# Patient Record
Sex: Female | Born: 1985 | Race: Black or African American | Hispanic: No | Marital: Single | Smoking: Current every day smoker
Health system: Southern US, Community
[De-identification: ages and names within clinical notes are randomized; demographics above are authoritative.]

## PROBLEM LIST (undated history)

## (undated) DIAGNOSIS — D649 Anemia, unspecified: Secondary | ICD-10-CM

---
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
## 2007-05-05 ENCOUNTER — Emergency Department (HOSPITAL_COMMUNITY): Admission: EM | Admit: 2007-05-05 | Discharge: 2007-05-05 | Payer: Self-pay | Admitting: Emergency Medicine

## 2007-09-09 ENCOUNTER — Emergency Department (HOSPITAL_COMMUNITY): Admission: EM | Admit: 2007-09-09 | Discharge: 2007-09-09 | Payer: Self-pay | Admitting: Emergency Medicine

## 2009-08-03 IMAGING — US US TRANSVAGINAL NON-OB
1 series · 14 of 25 positions shown · non-contrast
Comparison: None

CLINICAL DATA: Pelvic pain

TRANSVAGINAL ULTRASOUND OF PELVIS
TECHNIQUE: Transvaginal ultrasound examination of the pelvis was
performed including evaluation of the uterus, ovaries, adnexal
regions, and pelvic cul-de-sac.

[Series 1: unknown · 0.32mm/px · 14 of 51 slices shown]
[im 1/51]
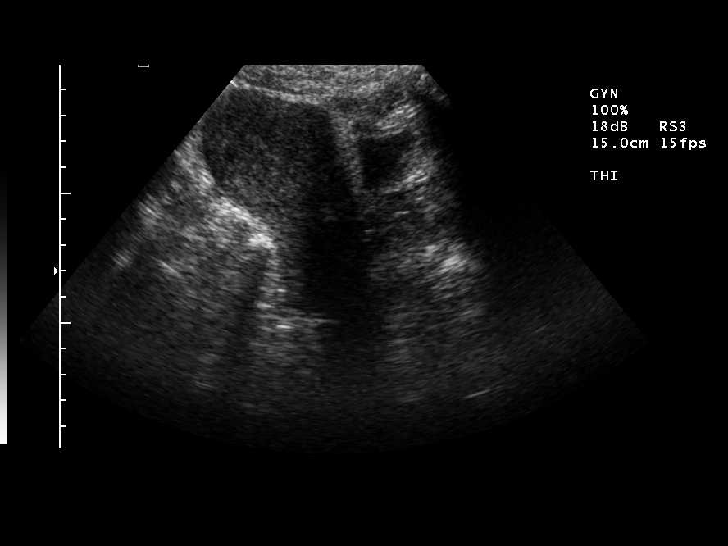
[im 5/51]
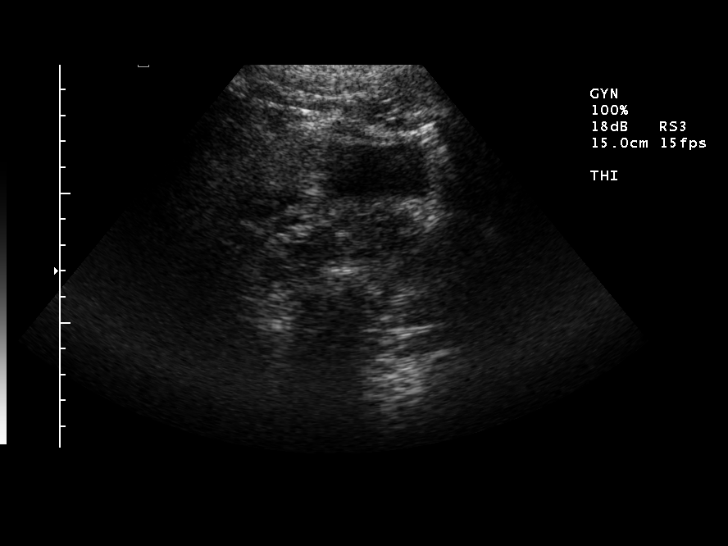
[im 9/51]
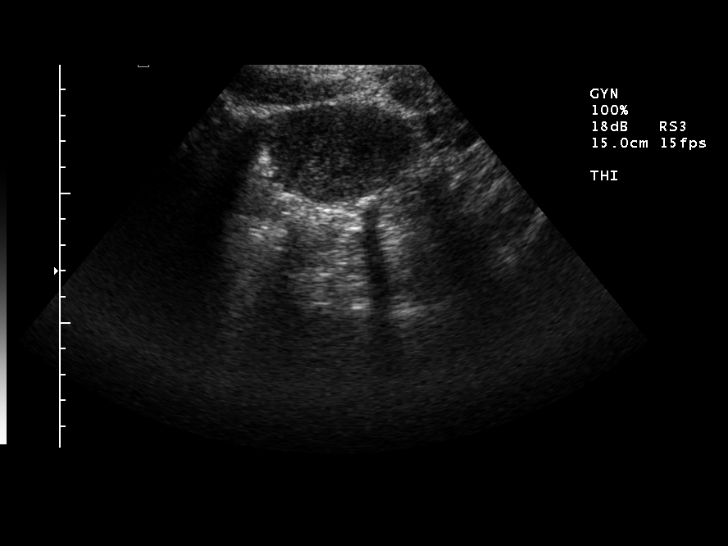
[im 13/51]
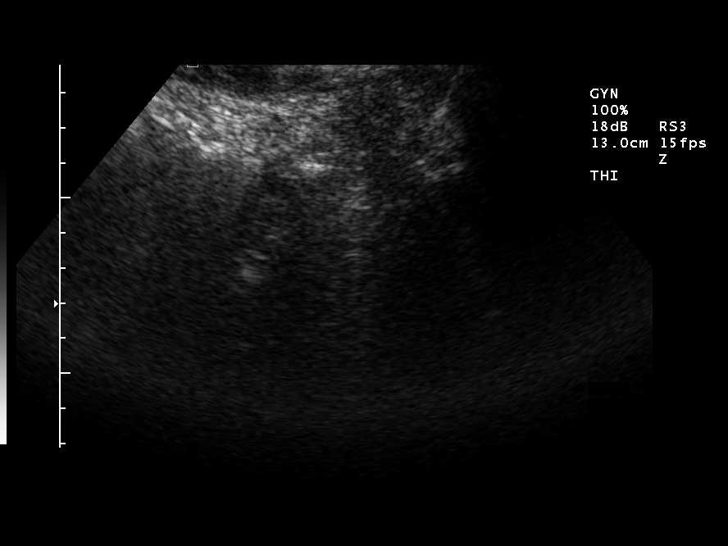
[im 17/51]
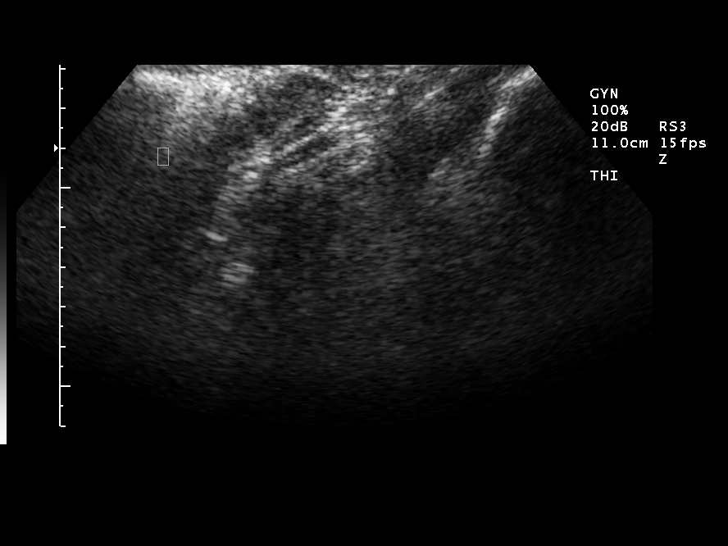
[im 19/51]
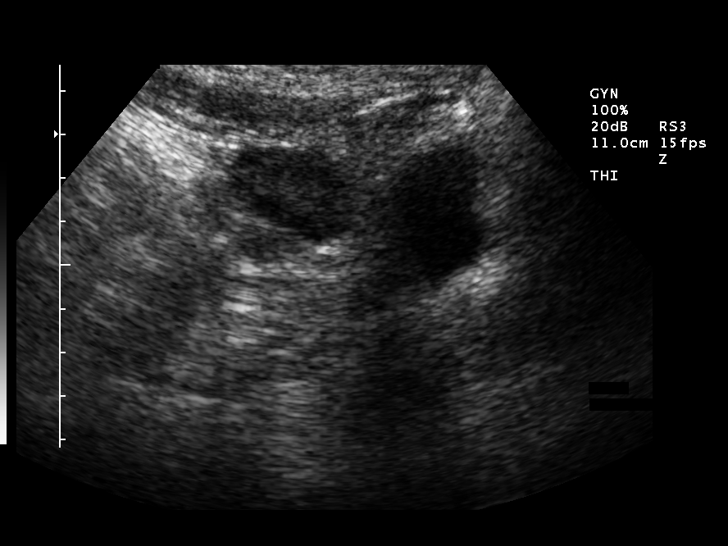
[im 23/51]
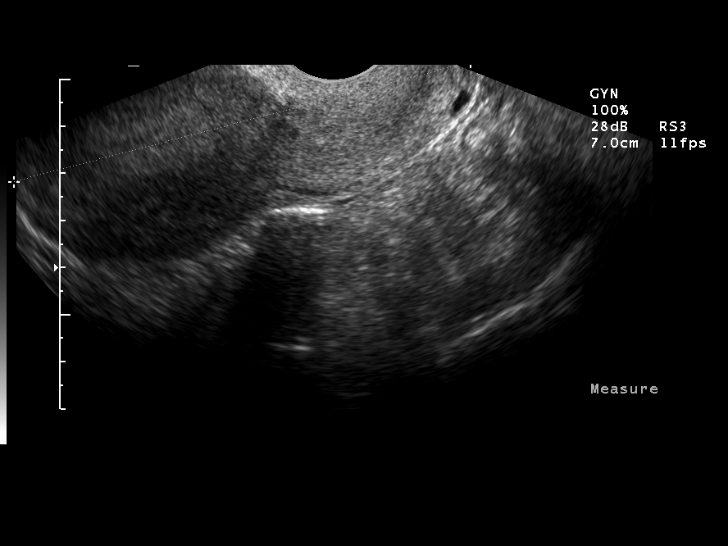
[im 28/51]
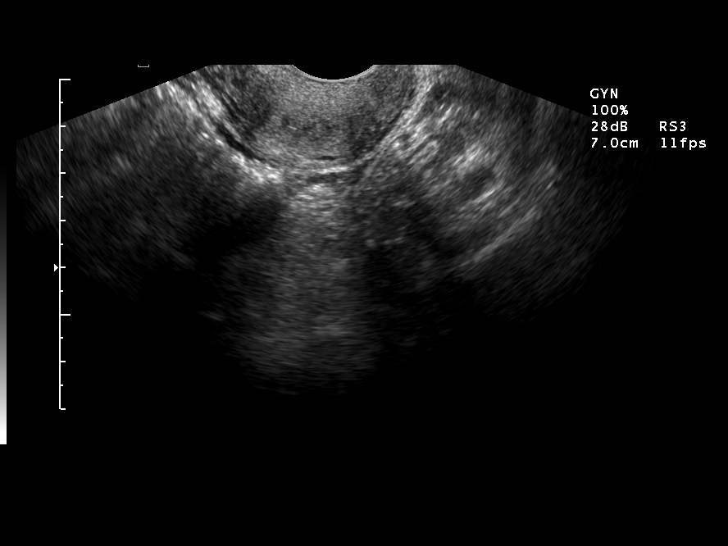
[im 32/51]
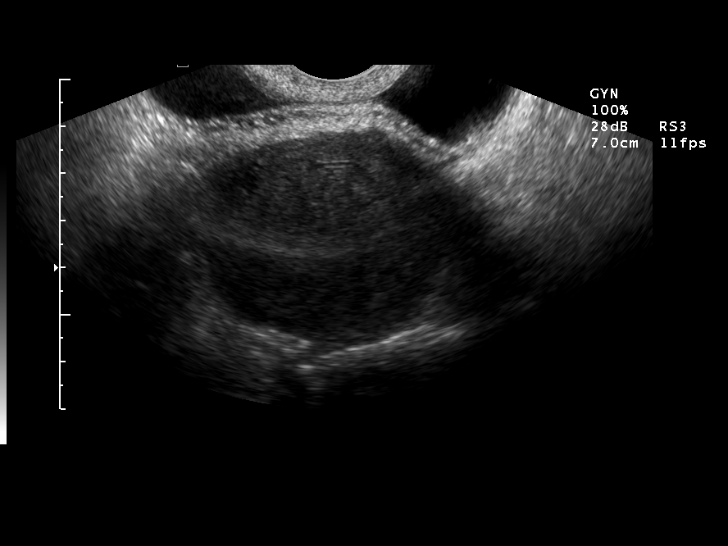
[im 34/51]
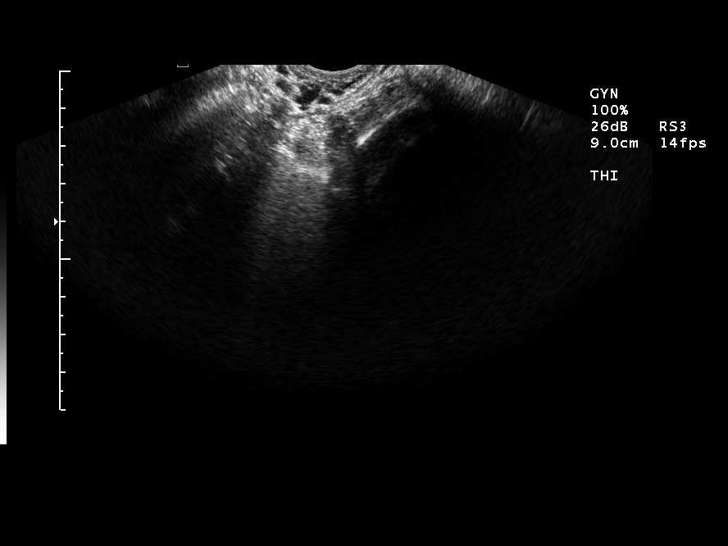
[im 38/51]
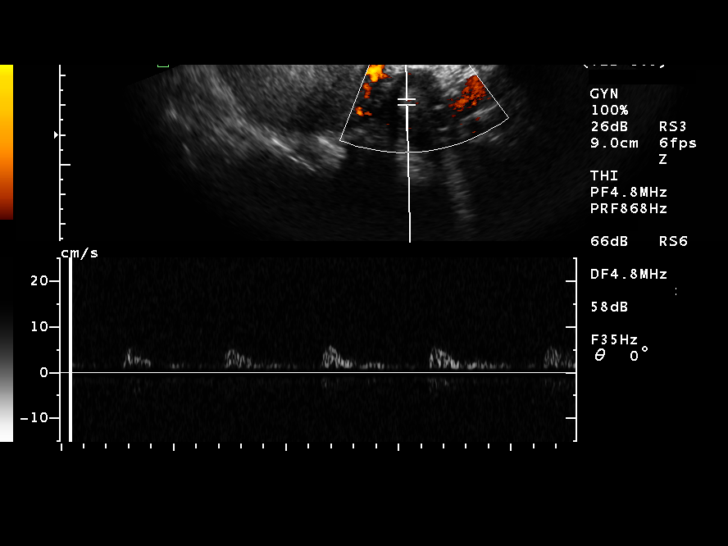
[im 42/51]
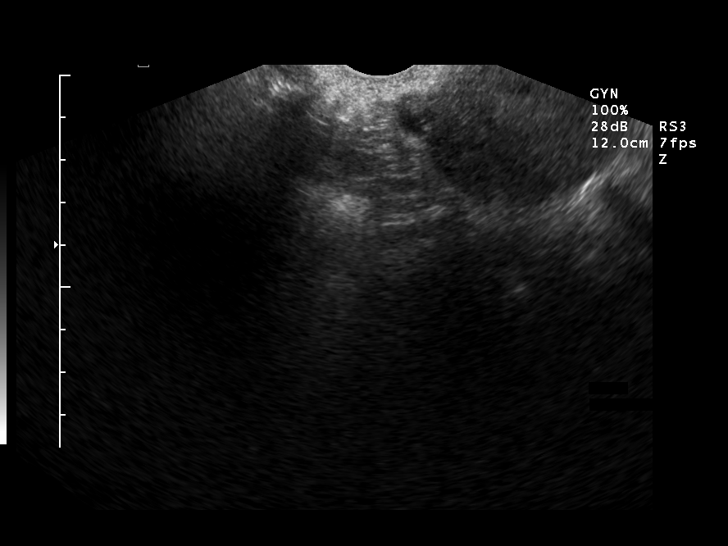
[im 46/51]
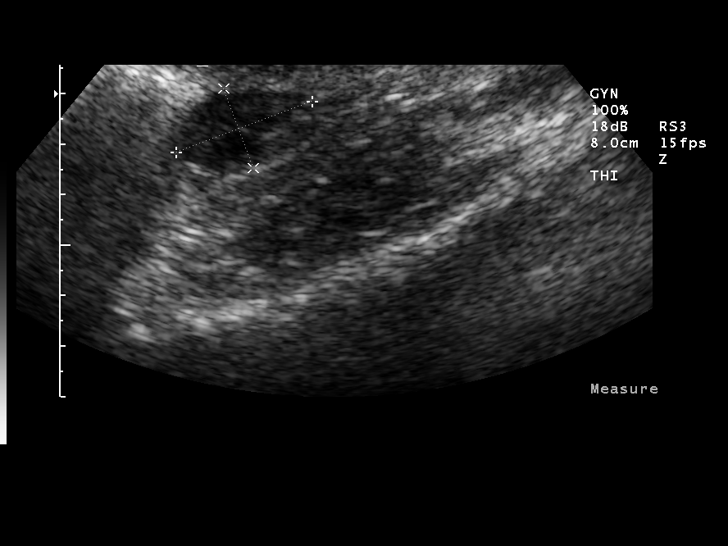
[im 51/51]
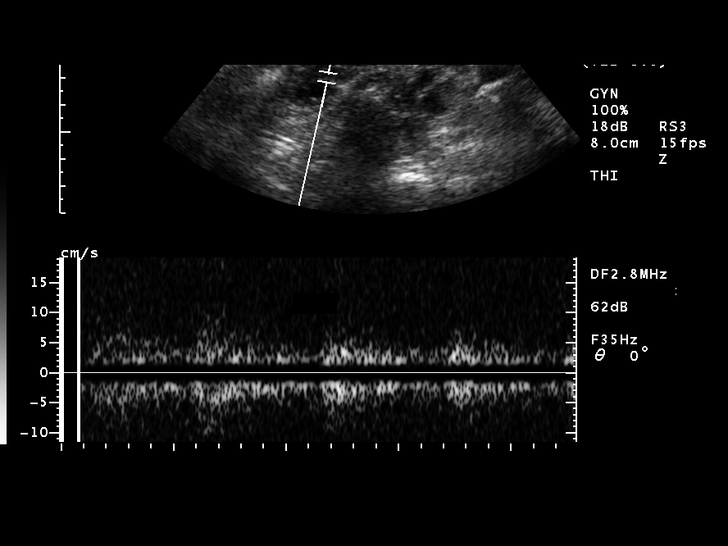

[14 of 25 positions shown; findings below may reference images not displayed]

FINDINGS: The uterus measures 10.0 cm in length and the fundus of
the uterus measures 5.2 x 5.9 cm  in transverse dimensions.
Endometrial stripe complex measures 1.1 cm in thickness.  The right
ovary measures 3.3 x 2.1 of 2.7 cm.  The left ovary measures 2.9 x
1.7 x 2.0 cm.  Bilateral ovarian arterial and venous flow.
Appropriate waveforms are noted.  No findings to suggest ovarian
torsion.  The uterus and adnexae are unremarkable for mass.  No
free fluid.
IMPRESSION: No acute findings.  Negative pelvic ultrasound.

## 2011-01-11 LAB — DIFFERENTIAL
Eosinophils Relative: 1
Lymphocytes Relative: 39
Monocytes Absolute: 0.3
Monocytes Relative: 6
Neutrophils Relative %: 52

## 2011-01-11 LAB — URINALYSIS, ROUTINE W REFLEX MICROSCOPIC
Glucose, UA: NEGATIVE
Hgb urine dipstick: NEGATIVE
Ketones, ur: NEGATIVE
Protein, ur: NEGATIVE
Specific Gravity, Urine: 1.031 — ABNORMAL HIGH
pH: 6.5

## 2011-01-11 LAB — WET PREP, GENITAL
Clue Cells Wet Prep HPF POC: NONE SEEN
Trich, Wet Prep: NONE SEEN
Yeast Wet Prep HPF POC: NONE SEEN

## 2011-01-11 LAB — GC/CHLAMYDIA PROBE AMP, GENITAL
Chlamydia, DNA Probe: NEGATIVE
GC Probe Amp, Genital: NEGATIVE

## 2011-01-11 LAB — COMPREHENSIVE METABOLIC PANEL
BUN: 10
Calcium: 9.3
Creatinine, Ser: 0.6
Glucose, Bld: 87
Total Protein: 7.2

## 2011-01-11 LAB — CBC
HCT: 30.6 — ABNORMAL LOW
Hemoglobin: 9.6 — ABNORMAL LOW
MCHC: 31.4
RDW: 21.9 — ABNORMAL HIGH

## 2011-01-11 LAB — LIPASE, BLOOD: Lipase: 23

## 2011-01-11 LAB — POCT PREGNANCY, URINE: Preg Test, Ur: NEGATIVE

## 2016-06-23 ENCOUNTER — Emergency Department (HOSPITAL_BASED_OUTPATIENT_CLINIC_OR_DEPARTMENT_OTHER)
Admission: EM | Admit: 2016-06-23 | Discharge: 2016-06-23 | Disposition: A | Discharge status: Home / Self Care | Payer: Medicaid Other | Attending: Emergency Medicine | Admitting: Emergency Medicine

## 2016-06-23 ENCOUNTER — Encounter (HOSPITAL_BASED_OUTPATIENT_CLINIC_OR_DEPARTMENT_OTHER): Payer: Self-pay | Admitting: *Deleted

## 2016-06-23 DIAGNOSIS — F17200 Nicotine dependence, unspecified, uncomplicated: Secondary | ICD-10-CM | POA: Insufficient documentation

## 2016-06-23 DIAGNOSIS — R51 Headache: Secondary | ICD-10-CM | POA: Insufficient documentation

## 2016-06-23 DIAGNOSIS — R519 Headache, unspecified: Secondary | ICD-10-CM

## 2016-06-23 DIAGNOSIS — R110 Nausea: Secondary | ICD-10-CM | POA: Insufficient documentation

## 2016-06-23 HISTORY — DX: Anemia, unspecified: D64.9

## 2016-06-23 LAB — BASIC METABOLIC PANEL
ANION GAP: 6 (ref 5–15)
BUN: 8 mg/dL (ref 6–20)
CALCIUM: 8.9 mg/dL (ref 8.9–10.3)
CHLORIDE: 106 mmol/L (ref 101–111)
CO2: 27 mmol/L (ref 22–32)
Creatinine, Ser: 0.73 mg/dL (ref 0.44–1.00)
GFR calc non Af Amer: >60 mL/min (ref >60)
GLUCOSE: 88 mg/dL (ref 65–99)
POTASSIUM: 3.4 mmol/L — AB (ref 3.5–5.1)
Sodium: 139 mmol/L (ref 135–145)

## 2016-06-23 LAB — CBC WITH DIFFERENTIAL/PLATELET
BASOS PCT: 0 %
Basophils Absolute: 0 10*3/uL (ref 0.0–0.1)
EOS ABS: 0.1 10*3/uL (ref 0.0–0.7)
EOS PCT: 1 %
HEMATOCRIT: 32.9 % — AB (ref 36.0–46.0)
Hemoglobin: 9.9 g/dL — ABNORMAL LOW (ref 12.0–15.0)
LYMPHS PCT: 26 %
Lymphs Abs: 2.6 10*3/uL (ref 0.7–4.0)
MCH: 22 pg — ABNORMAL LOW (ref 26.0–34.0)
MCHC: 30.1 g/dL (ref 30.0–36.0)
MCV: 73.1 fL — ABNORMAL LOW (ref 78.0–100.0)
MONOS PCT: 8 %
Monocytes Absolute: 0.8 10*3/uL (ref 0.1–1.0)
NEUTROS ABS: 6.5 10*3/uL (ref 1.7–7.7)
NEUTROS PCT: 65 %
Platelets: 300 10*3/uL (ref 150–400)
RBC: 4.5 MIL/uL (ref 3.87–5.11)
WBC: 10 10*3/uL (ref 4.0–10.5)

## 2016-06-23 LAB — URINALYSIS, ROUTINE W REFLEX MICROSCOPIC
BILIRUBIN URINE: NEGATIVE
GLUCOSE, UA: NEGATIVE mg/dL
HGB URINE DIPSTICK: NEGATIVE
KETONES UR: NEGATIVE mg/dL
Nitrite: NEGATIVE
PROTEIN: NEGATIVE mg/dL
Specific Gravity, Urine: 1.007 (ref 1.005–1.030)
pH: 7 (ref 5.0–8.0)

## 2016-06-23 LAB — URINALYSIS, MICROSCOPIC (REFLEX)

## 2016-06-23 LAB — PREGNANCY, URINE: PREG TEST UR: NEGATIVE

## 2016-06-23 MED ORDER — SODIUM CHLORIDE 0.9 % IV BOLUS (SEPSIS)
1000.0000 mL | Freq: Once | INTRAVENOUS | Status: AC
  Administered 2016-06-23: 1000 mL via INTRAVENOUS

## 2016-06-23 MED ORDER — METHYLPREDNISOLONE SODIUM SUCC 125 MG IJ SOLR
125.0000 mg | Freq: Once | INTRAMUSCULAR | Status: AC
  Administered 2016-06-23: 125 mg via INTRAVENOUS
  Filled 2016-06-23: qty 2

## 2016-06-23 MED ORDER — KETOROLAC TROMETHAMINE 30 MG/ML IJ SOLN
30.0000 mg | Freq: Once | INTRAMUSCULAR | Status: AC
  Administered 2016-06-23: 30 mg via INTRAVENOUS
  Filled 2016-06-23: qty 1

## 2016-06-23 MED ORDER — METOCLOPRAMIDE HCL 5 MG/ML IJ SOLN
10.0000 mg | Freq: Once | INTRAMUSCULAR | Status: AC
  Administered 2016-06-23: 10 mg via INTRAVENOUS
  Filled 2016-06-23: qty 2

## 2016-06-23 NOTE — ED Notes (Signed)
ED MD in to see, aware of HR 45

## 2016-06-23 NOTE — ED Provider Notes (Signed)
[facility]   CSN: [id number] Arrival date & time: 06/23/16  4098     History   Chief Complaint Chief Complaint  Patient presents with  . Headache    HPI Ashley Bass is a 31 y.o. female.  HPI  Ashley Bass is a 31 y.o. female, with a history of anemia, presenting to the ED with A headache intermittent for the last few weeks. Patient also endorses intermittent room spinning dizziness, nausea, and fatigue. Patient was seen at [facility] last week for the same complaint. Headache is bilateral, starts in the back of the head, rated 10 out of 10, pressure/squeezing, radiating towards the front of her head. Patient states her headache has now been constant for at least the last week. She has not tried any medications for her pain.  Deneis LOC, falls/trauma, vomiting, fever/chills, visual deficits, neuro deficits, or any other complaints.    Past Medical History:  Diagnosis Date  . Anemia     There are no active problems to display for this patient.   Past Surgical History:  Procedure Laterality Date  . CESAREAN SECTION      OB History    No data available       Home Medications    Prior to Admission medications   Medication Sig Start Date End Date Taking? Authorizing Provider  ferrous sulfate 325 (65 FE) MG tablet Take 325 mg by mouth daily with breakfast.   Yes Historical Provider, MD    Family History History reviewed. No pertinent family history.  Social History Social History  Substance Use Topics  . Smoking status: Current Every Day Smoker  . Smokeless tobacco: Never Used  . Alcohol use Not on file     Allergies   Penicillins   Review of Systems Review of Systems  Constitutional: Negative for chills, diaphoresis and fever.  Respiratory: Negative for shortness of breath.   Cardiovascular: Negative for chest pain.  Gastrointestinal: Positive for nausea. Negative for abdominal pain and vomiting.  Musculoskeletal: Negative  for back pain, neck pain and neck stiffness.  Neurological: Positive for dizziness (intermittent) and headaches. Negative for syncope, weakness and numbness.  All other systems reviewed and are negative.    Physical Exam Updated Vital Signs BP 131/85 (BP Location: Right Arm)   Pulse (!) 56   Temp 98.3 F (36.8 C) (Oral)   Resp 16   Ht 5\' 3"  (1.6 m)   Wt 73 kg   LMP 06/12/2016   SpO2 100%   BMI 28.52 kg/m   Physical Exam  Constitutional: She is oriented to person, place, and time. She appears well-developed and well-nourished. No distress.  HENT:  Head: Normocephalic and atraumatic.  Mouth/Throat: Oropharynx is clear and moist.  Eyes: Conjunctivae and EOM are normal. Pupils are equal, round, and reactive to light.  Neck: Normal range of motion. Neck supple.  Cardiovascular: Normal rate, regular rhythm, normal heart sounds and intact distal pulses.   Pulmonary/Chest: Effort normal and breath sounds normal. No respiratory distress.  Abdominal: Soft. There is no tenderness. There is no guarding.  Musculoskeletal: She exhibits no edema or tenderness.  Normal motor function intact in all extremities and spine. No midline spinal tenderness.   Lymphadenopathy:    She has no cervical adenopathy.  Neurological: She is alert and oriented to person, place, and time.  No sensory deficits. Strength 5/5 in all extremities. No gait disturbance. Coordination intact including heel to shin and finger to nose. Cranial nerves III-XII grossly intact.  No facial droop.   Skin: Skin is warm and dry. She is not diaphoretic.  Psychiatric: She has a normal mood and affect. Her behavior is normal.  Nursing note and vitals reviewed.    ED Treatments / Results  Labs (all labs ordered are listed, but only abnormal results are displayed) Labs Reviewed  URINALYSIS, ROUTINE W REFLEX MICROSCOPIC - Abnormal; Notable for the following:       Result Value   Leukocytes, UA TRACE (*)    All other components  within normal limits  URINALYSIS, MICROSCOPIC (REFLEX) - Abnormal; Notable for the following:    Bacteria, UA RARE (*)    Squamous Epithelial / LPF 0-5 (*)    All other components within normal limits  BASIC METABOLIC PANEL - Abnormal; Notable for the following:    Potassium 3.4 (*)    All other components within normal limits  CBC WITH DIFFERENTIAL/PLATELET - Abnormal; Notable for the following:    Hemoglobin 9.9 (*)    HCT 32.9 (*)    MCV 73.1 (*)    MCH 22.0 (*)    All other components within normal limits  PREGNANCY, URINE    EKG  EKG Interpretation  Date/Time:  Friday June 23 2016 09:00:12 EST Ventricular Rate:  59 PR Interval:    QRS Duration: 96 QT Interval:  436 QTC Calculation: 432 R Axis:   -23 Text Interpretation:  Sinus rhythm Borderline short PR interval Borderline left axis deviation Nonspecific T abnrm, anterolateral leads No old tracing to compare Confirmed by KNAPP  MD-J, JON ([id number]) on 06/23/2016 9:06:18 AM       Radiology No results found.  Procedures Procedures (including critical care time)  Medications Ordered in ED Medications  sodium chloride 0.9 % bolus 1,000 mL (0 mLs Intravenous Stopped 06/23/16 1202)  ketorolac (TORADOL) 30 MG/ML injection 30 mg (30 mg Intravenous Given 06/23/16 1052)  metoCLOPramide (REGLAN) injection 10 mg (10 mg Intravenous Given 06/23/16 1052)  methylPREDNISolone sodium succinate (SOLU-MEDROL) 125 mg/2 mL injection 125 mg (125 mg Intravenous Given 06/23/16 1052)     Initial Impression / Assessment and Plan / ED Course  I have reviewed the triage vital signs and the nursing notes.  Pertinent labs & imaging results that were available during my care of the patient were reviewed by me and considered in my medical decision making (see chart for details).     Patient presents with headache and dizziness for at least the last week, possibly for the last few weeks. Chart review reveals the patient [facility], found  to be anemic at 5.6, was admitted, and transfused. Her subsequent hemoglobin levels rose and then stabilized. [facility] for continued headache. Over the course of her previous ED visits she received a clear head CT and LP. I do not think there is anything else that we can contribute to her headache workup here in the ED. I do not see any indication for further emergent workup.   Upon reevaluation, patient voices resolution in her headache and improvement in her symptoms overall. She is able to ambulate without assistance. PCP versus neurology follow-up recommended. Return precautions discussed. Patient voices understanding of all instructions and is comfortable discharge.     Findings and plan of care discussed with Linwood DibblesJon Knapp, MD. Dr. Lynelle DoctorKnapp personally evaluated and examined this patient.   Vitals:   06/23/16 1000 06/23/16 1052 06/23/16 1100 06/23/16 1200  BP:  133/93 134/85 135/79  Pulse: (!) 49 (!) 50 (!) 58 (!) 49  Resp: 11 16 20 16   Temp:      TempSrc:      SpO2: 100% 100% 100% 100%  Weight:      Height:       Patient's bradycardic rate is noted. She is not hypotensive and is not orthostatic. A chart review reveals the patient has had a pulse rate in this range previously.   Orthostatic VS for the past 24 hrs:  BP- Lying Pulse- Lying BP- Sitting Pulse- Sitting BP- Standing at 0 minutes Pulse- Standing at 0 minutes  06/23/16 1252 128/77 (!) 47 130/81 56 129/71 54      Final Clinical Impressions(s) / ED Diagnoses   Final diagnoses:  Nonintractable episodic headache, unspecified headache type    New Prescriptions Discharge Medication List as of 06/23/2016 12:51 PM       Anselm Pancoast, PA-C 06/23/16 1717    Linwood Dibbles, MD 06/25/16 1430    Linwood Dibbles, MD 06/25/16 1431

## 2016-06-23 NOTE — ED Provider Notes (Signed)
Medical screening examination/treatment/procedure(s) were conducted as a shared visit with non-physician practitioner(s) and myself.  I personally evaluated the patient during the encounter.   EKG Interpretation  Date/Time:  Friday June 23 2016 09:00:12 EST Ventricular Rate:  59 PR Interval:    QRS Duration: 96 QT Interval:  436 QTC Calculation: 432 R Axis:   -23 Text Interpretation:  Sinus rhythm Borderline short PR interval Borderline left axis deviation Nonspecific T abnrm, anterolateral leads No old tracing to compare Confirmed by Staysha Truby  MD-J, Malyn Aytes ([id number]) on 06/23/2016 9:06:18 AM      Pt presented to the ED with persistent headache and dizziness.  [facility] and had extensive evaluation including head CT, and lp.  Pt was also given a blood transfusion for anemia.  No worrisome acute findings noted in the ED today.   Bradycardia noted but normal BP.  Doubt this is an acute issue.   Pt has follow up with a PCP.  Consider thyroid testing.   Linwood DibblesJon Amillion Macchia, MD 06/23/16 1250

## 2016-06-23 NOTE — ED Notes (Signed)
Pt was able to ambulate about 10 feet, without assistance, before getting dizzy.  Pt also noted to have a pulse rate of 50 prior to walking.

## 2016-06-23 NOTE — ED Triage Notes (Signed)
Pt reports ha "for weeks" [facility] at [facility] with workup and admission with blood transfusion on Friday, states "they didn't tell me nothing". Per written d/c instructions with pt, dx "secondary anemia to heavy periods", "decreased platelet count", and "headache" at d/c.pt reports ongoing ha and fatigue.

## 2016-06-23 NOTE — Discharge Instructions (Signed)
You have been seen today for a headache. Your lab results are stable from recent values.  You may take ibuprofen, naproxen, or tylenol for your headaches. Stay well hydrated by drinking at least eight 8oz glass of water throughout the day. You should follow up with a neurologist for your headaches. Call the number provided to set up an appointment.  You should also follow up with a primary care provider as you may need further lab testing, such as thyroid testing.  Should any symptoms worsen, please [facility].

## 2016-06-23 NOTE — ED Notes (Signed)
Patient denies pain and is resting comfortably.  

## 2017-06-20 ENCOUNTER — Encounter (HOSPITAL_BASED_OUTPATIENT_CLINIC_OR_DEPARTMENT_OTHER): Payer: Self-pay

## 2017-06-20 ENCOUNTER — Emergency Department (HOSPITAL_BASED_OUTPATIENT_CLINIC_OR_DEPARTMENT_OTHER)
Admission: EM | Admit: 2017-06-20 | Discharge: 2017-06-20 | Disposition: A | Discharge status: Home / Self Care | Payer: Self-pay | Attending: Emergency Medicine | Admitting: Emergency Medicine

## 2017-06-20 ENCOUNTER — Other Ambulatory Visit: Payer: Self-pay

## 2017-06-20 DIAGNOSIS — J029 Acute pharyngitis, unspecified: Secondary | ICD-10-CM | POA: Insufficient documentation

## 2017-06-20 DIAGNOSIS — F17210 Nicotine dependence, cigarettes, uncomplicated: Secondary | ICD-10-CM | POA: Insufficient documentation

## 2017-06-20 MED ORDER — AZITHROMYCIN 250 MG PO TABS: 250.0000 mg | ORAL_TABLET | Freq: Every day | ORAL | 0 refills | Status: DC

## 2017-06-20 MED FILL — AZITHROMYCIN 250 MG TABLET: 250 | 5 days supply | Qty: 6 | Fill #0

## 2017-06-20 NOTE — ED Triage Notes (Signed)
C/o URI sx x 1 week-NAD-steady gait 

## 2017-06-20 NOTE — ED Provider Notes (Signed)
[facility] EMERGENCY DEPARTMENT Provider Note   CSN: [id number] Arrival date & time: 06/20/17  1139     History   Chief Complaint Chief Complaint  Patient presents with  . URI    HPI Ashley Bass is a 32 y.o. female.  The history is provided by the patient and medical records. No language interpreter was used.  URI   Associated symptoms include congestion and sore throat. Pertinent negatives include no chest pain, no abdominal pain, no diarrhea, no nausea, no vomiting and no cough.   Ashley Bass is a 32 y.o. female with no pertinent PMH who presents to the emergency department for nasal congestion for 1 week and sore throat over the last 4 days.  Daughter with similar symptoms at home.  She was seen by pediatrician yesterday where strep test was performed and positive.  She was started on antibiotics.  Mother denies any fever, cough, shortness of breath, abdominal pain, nausea, vomiting.  No difficulty tolerating fluids or eating soft foods.  She is taken over-the-counter cold medication with little improvement.  Past Medical History:  Diagnosis Date  . Anemia     There are no active problems to display for this patient.   Past Surgical History:  Procedure Laterality Date  . CESAREAN SECTION      OB History    No data available       Home Medications    Prior to Admission medications   Medication Sig Start Date End Date Taking? Authorizing Provider  azithromycin (ZITHROMAX) 250 MG tablet Take 1 tablet (250 mg total) by mouth daily. Take first 2 tablets together, then 1 every day until finished. 06/20/17   Ward, Chase PicketJaime Pilcher, PA-C    Family History No family history on file.  Social History Social History   Tobacco Use  . Smoking status: Current Every Day Smoker    Types: Cigarettes  . Smokeless tobacco: Never Used  Substance Use Topics  . Alcohol use: No    Frequency: Never  . Drug use: Yes    Types: Marijuana     Allergies     Penicillins   Review of Systems Review of Systems  Constitutional: Negative for chills and fever.  HENT: Positive for congestion and sore throat. Negative for trouble swallowing.   Respiratory: Negative for cough.   Cardiovascular: Negative for chest pain.  Gastrointestinal: Negative for abdominal pain, diarrhea, nausea and vomiting.     Physical Exam Updated Vital Signs BP 125/81 (BP Location: Left Arm)   Pulse 67   Temp 98.8 F (37.1 C) (Oral)   Resp 18   Ht 5\' 3"  (1.6 m)   Wt 74.8 kg (165 lb)   LMP 05/25/2017   SpO2 100%   BMI 29.23 kg/m   Physical Exam  Constitutional: She appears well-developed and well-nourished. No distress.  HENT:  Head: Normocephalic and atraumatic.  Oropharynx with erythema and tonsillar exudates bilaterally.  Midline uvula with no peritonsillar abscess appreciated.  Neck: Neck supple.  Cardiovascular: Normal rate, regular rhythm and normal heart sounds.  No murmur heard. Pulmonary/Chest: Effort normal and breath sounds normal. No respiratory distress. She has no wheezes. She has no rales.  Musculoskeletal: Normal range of motion.  Lymphadenopathy:    She has cervical adenopathy (Anterior cervical adenopathy.).  Neurological: She is alert.  Skin: Skin is warm and dry.  Nursing note and vitals reviewed.    ED Treatments / Results  Labs (all labs ordered are listed, but only abnormal results are  displayed) Labs Reviewed - No data to display  EKG  EKG Interpretation None       Radiology No results found.  Procedures Procedures (including critical care time)  Medications Ordered in ED Medications - No data to display   Initial Impression / Assessment and Plan / ED Course  I have reviewed the triage vital signs and the nursing notes.  Pertinent labs & imaging results that were available during my care of the patient were reviewed by me and considered in my medical decision making (see chart for details).     Ashley Bass is a 32 y.o. female who presents to ED for sore throat. On exam, patient is afebrile with tonsillar exudates. Presentation not concerning for PTA or infection spread to soft tissue. No trismus or uvula deviation.  Daughter seen by pediatrician yesterday and diagnosed with strep throat after rapid strep positive.  Given patient's presentation and sick contact, will go ahead and treat.  She is allergic to penicillins.  Will treat with azithromycin. Specific return precautions discussed. Patient able to drink water in ED without difficulty with intact air way. Discussed importance of hydration. Recommended PCP follow up. All questions answered.   Final Clinical Impressions(s) / ED Diagnoses   Final diagnoses:  Exudative pharyngitis    ED Discharge Orders        Ordered    azithromycin (ZITHROMAX) 250 MG tablet  Daily     06/20/17 1315       Ward, Chase Picket, PA-C 06/20/17 1318    Raeford Razor, MD 06/20/17 1332

## 2017-06-20 NOTE — Discharge Instructions (Signed)
It was my pleasure taking care of you today!   Please take all of your antibiotics until finished!   Discard your toothbrush and begin using a new one in 3 days.   For sore throat, take ibuprofen every 6 hours as needed. Follow up with your doctor in 2-3 days if no improvement. Return to the ED sooner for worsening condition, inability to swallow, breathing difficulty, new concerns.

## 2017-10-22 ENCOUNTER — Encounter (HOSPITAL_BASED_OUTPATIENT_CLINIC_OR_DEPARTMENT_OTHER): Payer: Self-pay | Admitting: Emergency Medicine

## 2017-10-22 ENCOUNTER — Other Ambulatory Visit: Payer: Self-pay

## 2017-10-22 ENCOUNTER — Emergency Department (HOSPITAL_BASED_OUTPATIENT_CLINIC_OR_DEPARTMENT_OTHER)
Admission: EM | Admit: 2017-10-22 | Discharge: 2017-10-23 | Disposition: A | Discharge status: Home / Self Care | Payer: Self-pay | Attending: Emergency Medicine | Admitting: Emergency Medicine

## 2017-10-22 DIAGNOSIS — R51 Headache: Secondary | ICD-10-CM | POA: Insufficient documentation

## 2017-10-22 DIAGNOSIS — D509 Iron deficiency anemia, unspecified: Secondary | ICD-10-CM

## 2017-10-22 DIAGNOSIS — Z79899 Other long term (current) drug therapy: Secondary | ICD-10-CM | POA: Insufficient documentation

## 2017-10-22 DIAGNOSIS — F17210 Nicotine dependence, cigarettes, uncomplicated: Secondary | ICD-10-CM | POA: Insufficient documentation

## 2017-10-22 DIAGNOSIS — R110 Nausea: Secondary | ICD-10-CM

## 2017-10-22 DIAGNOSIS — R519 Headache, unspecified: Secondary | ICD-10-CM

## 2017-10-22 LAB — URINALYSIS, ROUTINE W REFLEX MICROSCOPIC
BILIRUBIN URINE: NEGATIVE
Glucose, UA: NEGATIVE mg/dL
Hgb urine dipstick: NEGATIVE
KETONES UR: NEGATIVE mg/dL
Leukocytes, UA: NEGATIVE
Nitrite: NEGATIVE
PH: 7 (ref 5.0–8.0)
Protein, ur: NEGATIVE mg/dL
SPECIFIC GRAVITY, URINE: 1.02 (ref 1.005–1.030)

## 2017-10-22 LAB — PREGNANCY, URINE: Preg Test, Ur: NEGATIVE

## 2017-10-22 NOTE — ED Notes (Addendum)
Pt c/o headache and tiredness for a week.  The last time she had OTC meds was this morning before work.  Pt was seen in April for the same thing

## 2017-10-22 NOTE — ED Triage Notes (Signed)
Pt c/o headache and dizziness with fatigue x 1 week. Pt also reports nausea, denies N/V.

## 2017-10-22 NOTE — ED Notes (Addendum)
Pt states she is compliant with her iron pills, has not followed up with GYN as was advised to do so [facility], denies any active bleeding.

## 2017-10-23 LAB — CBC WITH DIFFERENTIAL/PLATELET
BASOS ABS: 0 10*3/uL (ref 0.0–0.1)
Basophils Relative: 0 %
EOS ABS: 0.1 10*3/uL (ref 0.0–0.7)
Eosinophils Relative: 2 %
HEMATOCRIT: 33.7 % — AB (ref 36.0–46.0)
Hemoglobin: 10.8 g/dL — ABNORMAL LOW (ref 12.0–15.0)
Lymphocytes Relative: 46 %
Lymphs Abs: 2.9 10*3/uL (ref 0.7–4.0)
MCH: 22.3 pg — ABNORMAL LOW (ref 26.0–34.0)
MCHC: 32 g/dL (ref 30.0–36.0)
MCV: 69.6 fL — ABNORMAL LOW (ref 78.0–100.0)
MONO ABS: 0.5 10*3/uL (ref 0.1–1.0)
MONOS PCT: 8 %
NEUTROS ABS: 2.7 10*3/uL (ref 1.7–7.7)
Neutrophils Relative %: 44 %
Platelets: 263 10*3/uL (ref 150–400)
RBC: 4.84 MIL/uL (ref 3.87–5.11)
RDW: 24.7 % — AB (ref 11.5–15.5)
WBC: 6.2 10*3/uL (ref 4.0–10.5)

## 2017-10-23 LAB — BASIC METABOLIC PANEL
Anion gap: 7 (ref 5–15)
BUN: 13 mg/dL (ref 6–20)
CO2: 24 mmol/L (ref 22–32)
Calcium: 9 mg/dL (ref 8.9–10.3)
Chloride: 105 mmol/L (ref 98–111)
Creatinine, Ser: 0.65 mg/dL (ref 0.44–1.00)
GFR calc non Af Amer: >60 mL/min (ref >60)
Glucose, Bld: 94 mg/dL (ref 70–99)
Potassium: 3.8 mmol/L (ref 3.5–5.1)
Sodium: 136 mmol/L (ref 135–145)

## 2017-10-23 MED ORDER — ONDANSETRON HCL 4 MG/2ML IJ SOLN
INTRAMUSCULAR | Status: AC
  Filled 2017-10-23: qty 2

## 2017-10-23 MED ORDER — SODIUM CHLORIDE 0.9 % IV BOLUS
1000.0000 mL | Freq: Once | INTRAVENOUS | Status: AC
  Administered 2017-10-23: 1000 mL via INTRAVENOUS

## 2017-10-23 MED ORDER — ONDANSETRON HCL 4 MG/2ML IJ SOLN
4.0000 mg | Freq: Once | INTRAMUSCULAR | Status: AC
  Administered 2017-10-23: 4 mg via INTRAVENOUS

## 2017-10-23 NOTE — ED Provider Notes (Signed)
[facility] Provider Note: [name] Jusitn Salsgiver, MD, FACEP  CSN: [id number] MRN: [id number] ARRIVAL: 10/22/17 at 2121 ROOM: MH01/MH01   CHIEF COMPLAINT  Headache   HISTORY OF PRESENT ILLNESS  10/23/17 12:54 AM Ashley Bass is a 32 y.o. female with a 3-day history of headache and dizziness.  The headache is located in the back of her head and is not severe.  She has been taking over-the-counter analgesics for it with partial relief.  She is also having associated nausea but no vomiting or diarrhea.  She is vague about the meaning of her dizziness but describes it as both lightheadedness and as her surroundings moving.  She denies cold symptoms or fever.  She has felt chilled at times and feels generally weak.   Past Medical History:  Diagnosis Date  . Anemia     Past Surgical History:  Procedure Laterality Date  . CESAREAN SECTION      No family history on file.  Social History   Tobacco Use  . Smoking status: Current Every Day Smoker    Types: Cigarettes  . Smokeless tobacco: Never Used  Substance Use Topics  . Alcohol use: No    Frequency: Never  . Drug use: Yes    Types: Marijuana    Prior to Admission medications   Medication Sig Start Date End Date Taking? Authorizing Provider  ferrous sulfate 325 (65 FE) MG tablet Take 325 mg by mouth daily with breakfast.   Yes [provider]  Prenatal Vit-Fe Fumarate-FA (PRENATAL MULTIVITAMIN) TABS tablet Take 1 tablet by mouth daily at 12 noon.   Yes [provider]    Allergies Penicillins   REVIEW OF SYSTEMS  Negative except as noted here or in the History of Present Illness.   PHYSICAL EXAMINATION  Initial Vital Signs Blood pressure 106/81, pulse 62, temperature 99.3 F (37.4 C), temperature source Oral, resp. rate 16, height 5\' 3"  (1.6 m), weight 73.9 kg (163 lb), last menstrual period 10/06/2017, SpO2 100 %.  Examination General: Well-developed, well-nourished female in no acute  distress; appearance consistent with age of record HENT: normocephalic; atraumatic Eyes: pupils equal, round and reactive to light; extraocular muscles intact Neck: supple Heart: regular rate and rhythm Lungs: clear to auscultation bilaterally Abdomen: soft; nondistended; nontender; bowel sounds present Extremities: No deformity; full range of motion; pulses normal Neurologic: Awake, alert and oriented; motor function intact in all extremities and symmetric; no facial droop Skin: Warm and dry Psychiatric: Normal mood and affect   RESULTS  Summary of this visit's results, reviewed by myself:   EKG Interpretation  Date/Time:    Ventricular Rate:    PR Interval:    QRS Duration:   QT Interval:    QTC Calculation:   R Axis:     Text Interpretation:        Laboratory Studies: Results for orders placed or performed during the hospital encounter of 10/22/17 (from the past 24 hour(s))  Urinalysis, Routine w reflex microscopic     Status: Abnormal   Collection Time: 10/22/17  9:31 PM  Result Value Ref Range   Color, Urine YELLOW YELLOW   APPearance HAZY (A) CLEAR   Specific Gravity, Urine 1.020 1.005 - 1.030   pH 7.0 5.0 - 8.0   Glucose, UA NEGATIVE NEGATIVE mg/dL   Hgb urine dipstick NEGATIVE NEGATIVE   Bilirubin Urine NEGATIVE NEGATIVE   Ketones, ur NEGATIVE NEGATIVE mg/dL   Protein, ur NEGATIVE NEGATIVE mg/dL   Nitrite NEGATIVE NEGATIVE  Leukocytes, UA NEGATIVE NEGATIVE  Pregnancy, urine     Status: None   Collection Time: 10/22/17  9:31 PM  Result Value Ref Range   Preg Test, Ur NEGATIVE NEGATIVE  CBC with Differential/Platelet     Status: Abnormal   Collection Time: 10/23/17  1:13 AM  Result Value Ref Range   WBC 6.2 4.0 - 10.5 K/uL   RBC 4.84 3.87 - 5.11 MIL/uL   Hemoglobin 10.8 (L) 12.0 - 15.0 g/dL   HCT 16.1 (L) 09.6 - 04.5 %   MCV 69.6 (L) 78.0 - 100.0 fL   MCH 22.3 (L) 26.0 - 34.0 pg   MCHC 32.0 30.0 - 36.0 g/dL   RDW 40.9 (H) 81.1 - 91.4 %   Platelets  263 150 - 400 K/uL   Neutrophils Relative % 44 %   Lymphocytes Relative 46 %   Monocytes Relative 8 %   Eosinophils Relative 2 %   Basophils Relative 0 %   Neutro Abs 2.7 1.7 - 7.7 K/uL   Lymphs Abs 2.9 0.7 - 4.0 K/uL   Monocytes Absolute 0.5 0.1 - 1.0 K/uL   Eosinophils Absolute 0.1 0.0 - 0.7 K/uL   Basophils Absolute 0.0 0.0 - 0.1 K/uL   RBC Morphology POLYCHROMASIA PRESENT   Basic metabolic panel     Status: None   Collection Time: 10/23/17  1:13 AM  Result Value Ref Range   Sodium 136 135 - 145 mmol/L   Potassium 3.8 3.5 - 5.1 mmol/L   Chloride 105 98 - 111 mmol/L   CO2 24 22 - 32 mmol/L   Glucose, Bld 94 70 - 99 mg/dL   BUN 13 6 - 20 mg/dL   Creatinine, Ser 7.82 0.44 - 1.00 mg/dL   Calcium 9.0 8.9 - 95.6 mg/dL   GFR calc non Af Amer >60 >60 mL/min   GFR calc Af Amer >60 >60 mL/min   Anion gap 7 5 - 15   Imaging Studies: No results found.  ED COURSE and MDM  Nursing notes and initial vitals signs, including pulse oximetry, reviewed.  Vitals:   10/22/17 2126 10/22/17 2127 10/23/17 0049  BP: 124/78  106/81  Pulse: 82  62  Resp: 16  16  Temp: 99.3 F (37.4 C)    TempSrc: Oral    SpO2: 100%  100%  Weight:  73.9 kg (163 lb)   Height:  5\' 3"  (1.6 m)    1:48 AM Patient feeling better after IV fluid bolus.  She is already on iron supplementation and prenatal vitamins for her anemia.  She does not desire any prescriptions.  She was advised of her reassuring laboratory studies.  Given her low-grade fever this may represent an acute viral illness.  PROCEDURES    ED DIAGNOSES     ICD-10-CM   1. Headache in back of head R51   2. Microcytic anemia D50.9   3. Nausea R11.0        Raquel Sayres, Jonny Ruiz, MD 10/23/17 [phone]

## 2017-10-23 NOTE — ED Notes (Signed)
Pt verbalizes understanding of d/c instructions and denies any further needs at this time. 

## 2019-05-10 ENCOUNTER — Encounter (HOSPITAL_BASED_OUTPATIENT_CLINIC_OR_DEPARTMENT_OTHER): Payer: Self-pay

## 2019-05-10 ENCOUNTER — Emergency Department (HOSPITAL_BASED_OUTPATIENT_CLINIC_OR_DEPARTMENT_OTHER)
Admission: EM | Admit: 2019-05-10 | Discharge: 2019-05-10 | Disposition: A | Discharge status: Home / Self Care | Payer: Self-pay | Attending: Emergency Medicine | Admitting: Emergency Medicine

## 2019-05-10 ENCOUNTER — Other Ambulatory Visit: Payer: Self-pay

## 2019-05-10 DIAGNOSIS — F17210 Nicotine dependence, cigarettes, uncomplicated: Secondary | ICD-10-CM | POA: Insufficient documentation

## 2019-05-10 DIAGNOSIS — M545 Low back pain, unspecified: Secondary | ICD-10-CM

## 2019-05-10 DIAGNOSIS — Z880 Allergy status to penicillin: Secondary | ICD-10-CM | POA: Insufficient documentation

## 2019-05-10 LAB — URINALYSIS, ROUTINE W REFLEX MICROSCOPIC
Bilirubin Urine: NEGATIVE
Glucose, UA: NEGATIVE mg/dL
Ketones, ur: NEGATIVE mg/dL
Leukocytes,Ua: NEGATIVE
Nitrite: NEGATIVE
Protein, ur: NEGATIVE mg/dL
Specific Gravity, Urine: 1.025 (ref 1.005–1.030)
pH: 6 (ref 5.0–8.0)

## 2019-05-10 LAB — URINALYSIS, MICROSCOPIC (REFLEX): RBC / HPF: >50 RBC/hpf (ref 0–5)

## 2019-05-10 LAB — PREGNANCY, URINE: Preg Test, Ur: NEGATIVE

## 2019-05-10 MED ORDER — METHOCARBAMOL 500 MG PO TABS: 500.0000 mg | ORAL_TABLET | Freq: Three times a day (TID) | ORAL | 0 refills | Status: AC | PRN

## 2019-05-10 MED ORDER — PREDNISONE 10 MG (21) PO TBPK: ORAL_TABLET | Freq: Every day | ORAL | 0 refills | Status: AC

## 2019-05-10 MED ORDER — IBUPROFEN 800 MG PO TABS: 800.0000 mg | ORAL_TABLET | Freq: Three times a day (TID) | ORAL | 0 refills | Status: DC | PRN

## 2019-05-10 NOTE — ED Triage Notes (Signed)
Pt arrives ambulatory to ED with reports of back pain X 2 weeks. 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 Pt also reports radiation into left leg.

## 2019-05-10 NOTE — Discharge Instructions (Addendum)
You were seen in the emergency department for left-sided low back pain.  This is likely muscular pain or possibly due to a pinched disc.  We are prescribing you some anti-inflammatory medicine and a muscle relaxant.  You should do some gentle stretching.  Please follow-up with your primary care doctor for further evaluation.  Return to the emergency department if any acute worsening symptoms.

## 2019-05-10 NOTE — ED Provider Notes (Signed)
[facility] EMERGENCY DEPARTMENT Provider Note   CSN: [id number] Arrival date & time: 05/10/19  1119     History Chief Complaint  Patient presents with  . Back Pain    Ashley Bass is a 34 y.o. female.  She is complaining of left-sided low back pain radiating down into her left thigh that been going on for 2 weeks.  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  She has been taking ibuprofen without any improvement.  It is associated with some pins-and-needles sensation in her left leg.  No urinary symptoms.  No abdominal pain.  No fevers or chills no IV drug use.  Last menstrual period was now.  No incontinence of urine or stool although she says it is painful sitting on the toilet because it makes her back hurt more.  The history is provided by the patient.  Back Pain Location:  Lumbar spine, gluteal region and sacro-iliac joint Quality:  Stabbing Radiates to:  L thigh Pain severity:  Severe Pain is:  Same all the time Onset quality:  Gradual Duration:  2 weeks Timing:  Constant Progression:  Unchanged Chronicity:  New Context: occupational injury   Relieved by:  Nothing Worsened by:  Ambulation and bending Ineffective treatments:  NSAIDs Associated symptoms: leg pain and tingling   Associated symptoms: no abdominal pain, no bladder incontinence, no bowel incontinence, no chest pain, no dysuria, no fever and no weakness   Risk factors: no hx of cancer and no steroid use        Past Medical History:  Diagnosis Date  . Anemia     There are no problems to display for this patient.   Past Surgical History:  Procedure Laterality Date  . CESAREAN SECTION       OB History   No obstetric history on file.     No family history on file.  Social History   Tobacco Use  . Smoking status: Current Every Day Smoker    Types: Cigarettes  . Smokeless tobacco: Never Used  Substance Use Topics   . Alcohol use: No  . Drug use: Yes    Types: Marijuana    Home Medications Prior to Admission medications   Medication Sig Start Date End Date Taking? Authorizing Provider  ferrous sulfate 325 (65 FE) MG tablet Take 325 mg by mouth daily with breakfast.    [provider]  Prenatal Vit-Fe Fumarate-FA (PRENATAL MULTIVITAMIN) TABS tablet Take 1 tablet by mouth daily at 12 noon.    [provider]    Allergies    Penicillins  Review of Systems   Review of Systems  Constitutional: Negative for fever.  HENT: Negative for sore throat.   Eyes: Negative for visual disturbance.  Respiratory: Negative for shortness of breath.   Cardiovascular: Negative for chest pain.  Gastrointestinal: Negative for abdominal pain and bowel incontinence.  Genitourinary: Negative for bladder incontinence and dysuria.  Musculoskeletal: Positive for back pain.  Skin: Negative for rash.  Neurological: Positive for tingling. Negative for weakness.    Physical Exam Updated Vital Signs BP 124/70 (BP Location: Right Arm)   Pulse 71   Temp 98.3 F (36.8 C) (Oral)   Resp 18   Ht 5\' 3"  (1.6 m)   Wt 63.5 kg   LMP 05/10/2019   SpO2 100%   BMI 24.80 kg/m   Physical Exam Vitals and nursing note reviewed.  Constitutional:  General: She is not in acute distress.    Appearance: She is well-developed.  HENT:     Head: Normocephalic and atraumatic.  Eyes:     Conjunctiva/sclera: Conjunctivae normal.  Cardiovascular:     Rate and Rhythm: Normal rate and regular rhythm.     Heart sounds: No murmur.  Pulmonary:     Effort: Pulmonary effort is normal. No respiratory distress.     Breath sounds: Normal breath sounds.  Abdominal:     Palpations: Abdomen is soft.     Tenderness: There is no abdominal tenderness.  Musculoskeletal:       Arms:     Cervical back: Neck supple.  Skin:    General: Skin is warm and dry.  Neurological:     Mental Status: She is alert.     ED Results  / Procedures / Treatments   Labs (all labs ordered are listed, but only abnormal results are displayed) Labs Reviewed  URINALYSIS, ROUTINE W REFLEX MICROSCOPIC - Abnormal; Notable for the following components:      Result Value   APPearance CLOUDY (*)    Hgb urine dipstick LARGE (*)    All other components within normal limits  URINALYSIS, MICROSCOPIC (REFLEX) - Abnormal; Notable for the following components:   Bacteria, UA MANY (*)    All other components within normal limits  PREGNANCY, URINE    EKG None  Radiology No results found.  Procedures Procedures (including critical care time)  Medications Ordered in ED Medications - No data to display  ED Course  I have reviewed the triage vital signs and the nursing notes.  Pertinent labs & imaging results that were available during my care of the patient were reviewed by me and considered in my medical decision making (see chart for details).  Clinical Course as of May 09 1722  Sat May 09, 6668  [id number] 34 year old female here with left-sided low back pain.  Likely musculoskeletal or component of some disc radicular.  Checking urine and pregnancy make sure is not a UTI.  Distal neurovascular intact.  No back pain red flags.  Doubt imaging would add anything   [MB]  1213 Patient's urine showing large blood but she also said she is having her period here.  Doubt this is her renal colic as she has very reproducible pain with palpation and manipulation.   [MB]    Clinical Course User Index [MB] Hayden Rasmussen, MD   MDM Rules/Calculators/A&P                       Final Clinical Impression(s) / ED Diagnoses Final diagnoses:  Acute left-sided low back pain, unspecified whether sciatica present    Rx / DC Orders ED Discharge Orders         Ordered    ibuprofen (ADVIL) 800 MG tablet  Every 8 hours PRN     05/10/19 1216    methocarbamol (ROBAXIN) 500 MG tablet  Every 8 hours PRN     05/10/19 1216    predniSONE (STERAPRED  UNI-PAK 21 TAB) 10 MG (21) TBPK tablet  Daily     05/10/19 1216           Hayden Rasmussen, MD 05/10/19 1724

## 2020-04-05 ENCOUNTER — Inpatient Hospital Stay (HOSPITAL_COMMUNITY): Payer: Medicaid Other

## 2020-04-05 ENCOUNTER — Inpatient Hospital Stay (HOSPITAL_BASED_OUTPATIENT_CLINIC_OR_DEPARTMENT_OTHER)
Admission: EM | Admit: 2020-04-05 | Discharge: 2020-04-05 | Disposition: A | Discharge status: Home / Self Care | Payer: Medicaid Other | Attending: Obstetrics & Gynecology | Admitting: Obstetrics & Gynecology

## 2020-04-05 ENCOUNTER — Other Ambulatory Visit: Payer: Self-pay

## 2020-04-05 ENCOUNTER — Encounter (HOSPITAL_BASED_OUTPATIENT_CLINIC_OR_DEPARTMENT_OTHER): Payer: Self-pay | Admitting: *Deleted

## 2020-04-05 DIAGNOSIS — O26891 Other specified pregnancy related conditions, first trimester: Secondary | ICD-10-CM | POA: Diagnosis not present

## 2020-04-05 DIAGNOSIS — O26899 Other specified pregnancy related conditions, unspecified trimester: Secondary | ICD-10-CM

## 2020-04-05 DIAGNOSIS — R102 Pelvic and perineal pain: Secondary | ICD-10-CM | POA: Insufficient documentation

## 2020-04-05 DIAGNOSIS — O0001 Abdominal pregnancy with intrauterine pregnancy: Secondary | ICD-10-CM | POA: Diagnosis not present

## 2020-04-05 DIAGNOSIS — R109 Unspecified abdominal pain: Secondary | ICD-10-CM

## 2020-04-05 DIAGNOSIS — Z3A01 Less than 8 weeks gestation of pregnancy: Secondary | ICD-10-CM | POA: Insufficient documentation

## 2020-04-05 DIAGNOSIS — Z3491 Encounter for supervision of normal pregnancy, unspecified, first trimester: Secondary | ICD-10-CM

## 2020-04-05 DIAGNOSIS — O23591 Infection of other part of genital tract in pregnancy, first trimester: Secondary | ICD-10-CM | POA: Insufficient documentation

## 2020-04-05 DIAGNOSIS — A5901 Trichomonal vulvovaginitis: Secondary | ICD-10-CM

## 2020-04-05 LAB — ABO/RH: ABO/RH(D): B POS

## 2020-04-05 LAB — HCG, QUANTITATIVE, PREGNANCY: hCG, Beta Chain, Quant, S: 14471 m[IU]/mL — ABNORMAL HIGH (ref <5)

## 2020-04-05 LAB — CBC WITH DIFFERENTIAL/PLATELET
Abs Immature Granulocytes: 0.03 10*3/uL (ref 0.00–0.07)
Basophils Absolute: 0.1 10*3/uL (ref 0.0–0.1)
Basophils Relative: 1 %
Eosinophils Absolute: 0 10*3/uL (ref 0.0–0.5)
Eosinophils Relative: 1 %
HCT: 29.1 % — ABNORMAL LOW (ref 36.0–46.0)
Hemoglobin: 8.6 g/dL — ABNORMAL LOW (ref 12.0–15.0)
Immature Granulocytes: 0 %
Lymphocytes Relative: 26 %
Lymphs Abs: 1.9 10*3/uL (ref 0.7–4.0)
MCH: 18.9 pg — ABNORMAL LOW (ref 26.0–34.0)
MCHC: 29.6 g/dL — ABNORMAL LOW (ref 30.0–36.0)
MCV: 63.8 fL — ABNORMAL LOW (ref 80.0–100.0)
Monocytes Absolute: 0.4 10*3/uL (ref 0.1–1.0)
Monocytes Relative: 5 %
Neutro Abs: 5 10*3/uL (ref 1.7–7.7)
Neutrophils Relative %: 67 %
Platelets: 297 10*3/uL (ref 150–400)
RBC: 4.56 MIL/uL (ref 3.87–5.11)
RDW: 27.2 % — ABNORMAL HIGH (ref 11.5–15.5)
WBC: 7.5 10*3/uL (ref 4.0–10.5)
nRBC: 0 % (ref 0.0–0.2)

## 2020-04-05 LAB — URINALYSIS, ROUTINE W REFLEX MICROSCOPIC
Bilirubin Urine: NEGATIVE
Glucose, UA: NEGATIVE mg/dL
Hgb urine dipstick: NEGATIVE
Ketones, ur: NEGATIVE mg/dL
Leukocytes,Ua: NEGATIVE
Nitrite: NEGATIVE
Protein, ur: NEGATIVE mg/dL
Specific Gravity, Urine: >1.03 — ABNORMAL HIGH (ref 1.005–1.030)
pH: 6 (ref 5.0–8.0)

## 2020-04-05 LAB — HIV ANTIBODY (ROUTINE TESTING W REFLEX): HIV Screen 4th Generation wRfx: NONREACTIVE

## 2020-04-05 LAB — WET PREP, GENITAL
Clue Cells Wet Prep HPF POC: NONE SEEN
Sperm: NONE SEEN
Yeast Wet Prep HPF POC: NONE SEEN

## 2020-04-05 LAB — PREGNANCY, URINE: Preg Test, Ur: POSITIVE — AB

## 2020-04-05 MED ORDER — METRONIDAZOLE 500 MG PO TABS
2000.0000 mg | ORAL_TABLET | Freq: Once | ORAL | Status: AC
  Administered 2020-04-05: 2000 mg via ORAL
  Filled 2020-04-05: qty 4

## 2020-04-05 NOTE — Discharge Instructions (Signed)
Expedited Partner Therapy:  °Information Sheet for Patients and Partners  °            ° °You have been offered expedited partner therapy (EPT). This information sheet contains important information and warnings you need to be aware of, so please read it carefully.  ° °Expedited Partner Therapy (EPT) is the clinical practice of treating the sexual partners of persons who receive chlamydia, gonorrhea, or trichomoniasis diagnoses by providing medications or prescriptions to the patient. Patients then provide partners with these therapies without the health-care provider having examined the partner. In other words, EPT is a convenient, fast and private way for patients to help their sexual partners get treated.  ° °Chlamydia and gonorrhea are bacterial infections you get from having sex with a person who is already infected. Trichomoniasis (or “trich”) is a very common sexually transmitted infection (STI) that is caused by infection with a protozoan parasite called Trichomonas vaginalis.  Many people with these infections don’t know it because they feel fine, but without treatment these infections can cause serious health problems, such as pelvic inflammatory disease, ectopic pregnancy, infertility and increased risk of HIV.  ° °It is important to get treated as soon as possible to protect your health, to avoid spreading these infections to others, and to prevent yourself from becoming re-infected. The good news is these infections can be easily cured with proper antibiotic medicine. The best way to take care of your self is to see a doctor or go to your local health department. If you are not able to see a doctor or other medical provider, you should take EPT.  ° ° °Recommended Medication: °EPT for Chlamydia:  Azithromycin (Zithromax) 1 gram orally in a single dose °EPT for Gonorrhea:  Cefixime (Suprax) 400 milligrams orally in a single dose PLUS azithromycin (Zithromax) 1 gram orally in a single dose °EPT for  Trichomoniasis:  Metronidazole (Flagyl) 2 grams orally in a single dose ° ° °These medicines are very safe. However, you should not take them if you have ever had an allergic reaction (like a rash) to any of these medicines: azithromycin (Zithromax), erythromycin, clarithromycin (Biaxin), metronidazole (Flagyl), tinidazole (Tindimax). If you are uncertain about whether you have an allergy, call your medical provider or pharmacist before taking this medicine. If you have a serious, long-term illness like kidney, liver or heart disease, colitis or stomach problems, or you are currently taking other prescription medication, talk to your provider before taking this medication.  ° °Women: If you have lower belly pain, pain during sex, vomiting, or a fever, do not take this medicine. Instead, you should see a medical provider to be certain you do not have pelvic inflammatory disease (PID). PID can be serious and lead to infertility, pregnancy problems or chronic pelvic pain.  ° °Pregnant Women: It is very important for you to see a doctor to get pregnancy services and pre-natal care. These antibiotics for EPT are safe for pregnant women, but you still need to see a medical provider as soon as possible. It is also important to note that Doxycycline is an alternative therapy for chlamydia, but it should not be taken by someone who is pregnant.  ° °Men: If you have pain or swelling in the testicles or a fever, do not take this medicine and see a medical provider.    ° °Men who have sex with men (MSM): MSM in [place] continue to experience high rates of syphilis and HIV. Many MSM with gonorrhea or   chlamydia could also have syphilis and/or HIV and not know it. If you are a man who has sex with other men, it is very important that you see a medical provider and are tested for HIV and syphilis. EPT is not recommended for gonorrhea for MSM.  Recommended treatment for gonorrhea for MSM is Rocephin (shot) AND azithromycin  due to decreased cure rate.  Please see your medical provider if this is the case.   ° °Along with this information sheet is a prescription for the medicine. If you receive a prescription it will be in your name and will indicate your date of birth, or it will be in the name of “Expedited Partner Therapy”.   In either case, you can have the prescription filled at a pharmacy. You will be responsible for the cost of the medicine, unless you have prescription drug coverage. In that case, you could provide your name so the pharmacy could bill your health plan.  ° °Take the medication as directed. Some people will have a mild, upset stomach, which does not last long. AVOID alcohol 24 hours after taking metronidazole (Flagyl) to reduce the possibility of a disulfiram-like reaction (severe vomiting and abdominal pain).  After taking the medicine, do not have sex for 7 days. Do not share this medicine or give it to anyone else. It is important to tell everyone you have had sex with in the last 60 days that they need to go and get tested for sexually transmitted infections.  ° °Ways to prevent these and other sexually transmitted infections (STIs):  ° °• Abstain from sex. This is the only sure way to avoid getting an STI.  °• Use barrier methods, such as condoms, consistently and correctly.  °• Limit the number of sexual partners.  °• Have regular physical exams, including testing for STIs.  ° °For more information about EPT or other issues pertaining to an STI, please contact your medical provider or the [place] [facility] at [phone] or [email or web address]   ° °

## 2020-04-05 NOTE — [facility] Note (Signed)
Pt sent from [facility] with c/o of vag discharge clear to yellow that started 2-3 days ago. +Pregnancy test. Pt denies any pain or bleeding at this time but has had some cramping on and off for a few days.

## 2020-04-05 NOTE — ED Triage Notes (Signed)
C/o vaginal discharge x 4 days , no relief with OTC meds , positive home preg test

## 2020-04-05 NOTE — ED Provider Notes (Signed)
[facility] EMERGENCY DEPARTMENT Provider Note   CSN: [id number] Arrival date & time: 04/05/20  1517     History Chief Complaint  Patient presents with  . Vaginal Discharge    Ashley Bass is a 34 y.o. female.   presents to ER with concern for vaginal discharge.  Patient reports vaginal discharge for last 4 days, sometimes clear, sometimes yellow.  She also reports had a positive home pregnancy test.  Last menstrual cycle November 17.  On review of systems, patient also endorsed abdominal cramping, states that she feels like she is having.  Type cramping in her lower abdomen/pelvic region.  Central and left and right.  No vaginal bleeding. 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 Not on any form of birth control, no prior hx of ectopic.   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  Has not recently been tested for STDs.  HPI     Past Medical History:  Diagnosis Date  . Anemia     There are no problems to display for this patient.   Past Surgical History:  Procedure Laterality Date  . CESAREAN SECTION       OB History   No obstetric history on file.     No family history on file.  Social History   Tobacco Use  . Smoking status: Current Every Day Smoker    Types: Cigarettes  . Smokeless tobacco: Never Used  Substance Use Topics  . Alcohol use: No  . Drug use: Yes    Types: Marijuana    Home Medications Prior to Admission medications   Medication Sig Start Date End Date Taking? Authorizing Provider  ferrous sulfate 325 (65 FE) MG tablet Take 325 mg by mouth daily with breakfast.    [provider]  ibuprofen (ADVIL) 800 MG tablet Take 1 tablet (800 mg total) by mouth every 8 (eight) hours as needed. 05/10/19   Terrilee Files, MD  methocarbamol (ROBAXIN) 500 MG tablet Take 1 tablet (500 mg total) by mouth every 8 (eight) hours as needed for muscle spasms. 05/10/19   Terrilee Files,  MD  predniSONE (STERAPRED UNI-PAK 21 TAB) 10 MG (21) TBPK tablet Take by mouth daily. Take 6 tablets first day and decrease by 1 tablet each day until finished 05/10/19   Terrilee Files, MD  Prenatal Vit-Fe Fumarate-FA (PRENATAL MULTIVITAMIN) TABS tablet Take 1 tablet by mouth daily at 12 noon.    [provider]    Allergies    Penicillins  Review of Systems   Review of Systems  Constitutional: Negative for chills and fever.  HENT: Negative for ear pain and sore throat.   Eyes: Negative for pain and visual disturbance.  Respiratory: Negative for cough and shortness of breath.   Cardiovascular: Negative for chest pain and palpitations.  Gastrointestinal: Positive for abdominal pain. Negative for vomiting.  Genitourinary: Positive for vaginal discharge and vaginal pain. Negative for dysuria and hematuria.  Musculoskeletal: Negative for arthralgias and back pain.  Skin: Negative for color change and rash.  Neurological: Negative for seizures and syncope.  All other systems reviewed and are negative.   Physical Exam Updated Vital Signs BP 123/77 (BP Location: Right Arm)   Pulse 73   Temp 97.8 F (36.6 C) (Oral)   Resp 18   Ht 5\' 3"  (1.6 m)   Wt 61.2 kg   LMP 03/03/2020   SpO2 100%   BMI 23.91 kg/m   Physical  Exam Vitals and nursing note reviewed.  Constitutional:      General: She is not in acute distress.    Appearance: She is well-developed and well-nourished.  HENT:     Head: Normocephalic and atraumatic.  Eyes:     Conjunctiva/sclera: Conjunctivae normal.  Pulmonary:     Effort: Pulmonary effort is normal. No respiratory distress.  Abdominal:     Palpations: Abdomen is soft.     Tenderness: There is no abdominal tenderness.  Musculoskeletal:        General: No edema.     Cervical back: Neck supple.  Skin:    General: Skin is warm and dry.  Neurological:     General: No focal deficit present.     Mental Status: She is alert.  Psychiatric:         Mood and Affect: Mood and affect and mood normal.        Behavior: Behavior normal.     ED Results / Procedures / Treatments   Labs (all labs ordered are listed, but only abnormal results are displayed) Labs Reviewed  URINALYSIS, ROUTINE W REFLEX MICROSCOPIC - Abnormal; Notable for the following components:      Result Value   Specific Gravity, Urine >1.030 (*)    All other components within normal limits  PREGNANCY, URINE - Abnormal; Notable for the following components:   Preg Test, Ur POSITIVE (*)    All other components within normal limits  WET PREP, GENITAL  RPR  HIV ANTIBODY (ROUTINE TESTING W REFLEX)  HCG, QUANTITATIVE, PREGNANCY  CBC WITH DIFFERENTIAL/PLATELET  ABO/RH  GC/CHLAMYDIA PROBE AMP ([place]) NOT AT [facility]    EKG None  Radiology No results found.  Procedures Procedures (including critical care time)  Medications Ordered in ED Medications - No data to display  ED Course  I have reviewed the triage vital signs and the nursing notes.  Pertinent labs & imaging results that were available during my care of the patient were reviewed by me and considered in my medical decision making (see chart for details).    MDM Rules/Calculators/A&P                          34 year old lady ~5 weeks by LMP presents with concern for vaginal discharge, endorsed lower abdominal/pelvic pain/cramping as well.  No prior care for this pregnancy, no prior ultrasound imaging.  Believe patient would benefit from getting TVUS to rule out ectopic.  Ultrasound not currently available at [facility].  Will send to [facility].  Reviewed case with Dr. Adrian Blackwater who accepts patient.  Discussed risk and benefits of transport via [facility] versus [facility].  Patient has elected to go [facility] with her sister.  Will send quant, CBC, Rh for OB/GYN to follow-up.  Will defer exam, STD testing to gynecology this evening.  Final Clinical Impression(s) / ED Diagnoses Final diagnoses:  Abdominal pain,  unspecified abdominal location  First trimester pregnancy    Rx / DC Orders ED Discharge Orders    None       Milagros Loll, MD 04/05/20 [phone]

## 2020-04-05 NOTE — ED Notes (Signed)
[phone] Dr Adrian Blackwater

## 2020-04-05 NOTE — MAU Provider Note (Signed)
History     CSN: [id number]  Arrival date and time: 04/05/20 1517   Chief Complaint  Patient presents with  . Vaginal Discharge   Ashley Bass is a 34 y.o. early pregnant female at [redacted]w[redacted]d by LMP who presents to MAU as a transfer from [facility] to r/o ectopic pregnancy. Patient initially presented to [facility] for vaginal discharge. Reports vaginal discharge has been occurring for the past 4 days. Describes discharge as yellowish clear without odor. She reports vaginal discharge is associated with abdominal pain. Describes as lower abdominal cramping that is intermittent. Denies vaginal bleeding. 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    Past Medical History:  Diagnosis Date  . Anemia     Past Surgical History:  Procedure Laterality Date  . CESAREAN SECTION      No family history on file.  Social History   Tobacco Use  . Smoking status: Current Every Day Smoker    Types: Cigarettes  . Smokeless tobacco: Never Used  Substance Use Topics  . Alcohol use: No  . Drug use: Yes    Types: Marijuana    Allergies:  Allergies  Allergen Reactions  . Penicillins     Medications Prior to Admission  Medication Sig Dispense Refill Last Dose  . ferrous sulfate 325 (65 FE) MG tablet Take 325 mg by mouth daily with breakfast.     . ibuprofen (ADVIL) 800 MG tablet Take 1 tablet (800 mg total) by mouth every 8 (eight) hours as needed. 21 tablet 0   . methocarbamol (ROBAXIN) 500 MG tablet Take 1 tablet (500 mg total) by mouth every 8 (eight) hours as needed for muscle spasms. 15 tablet 0   . predniSONE (STERAPRED UNI-PAK 21 TAB) 10 MG (21) TBPK tablet Take by mouth daily. Take 6 tablets first day and decrease by 1 tablet each day until finished 21 tablet 0   . Prenatal Vit-Fe Fumarate-FA (PRENATAL MULTIVITAMIN) TABS tablet Take 1 tablet by mouth daily at 12 noon.       Review of Systems  Constitutional: Negative.   Respiratory: Negative.   Cardiovascular: Negative.    Gastrointestinal: Positive for abdominal pain. Negative for constipation, diarrhea, nausea and vomiting.  Genitourinary: Positive for vaginal discharge. Negative for difficulty urinating, dysuria, frequency, pelvic pain, urgency and vaginal bleeding.  Musculoskeletal: Negative.   Neurological: Negative.   Psychiatric/Behavioral: Negative.    Physical Exam   Blood pressure (!) 111/58, pulse 62, temperature 97.8 F (36.6 C), temperature source Oral, resp. rate 18, height 5\' 3"  (1.6 m), weight 61.2 kg, last menstrual period 03/03/2020, SpO2 100 %.  Physical Exam Vitals and nursing note reviewed.  Constitutional:      Appearance: Normal appearance.  HENT:     Head: Normocephalic.  Cardiovascular:     Rate and Rhythm: Normal rate and regular rhythm.  Pulmonary:     Effort: Pulmonary effort is normal. No respiratory distress.     Breath sounds: Normal breath sounds. No wheezing.  Abdominal:     General: There is no distension.     Palpations: Abdomen is soft. There is no mass.     Tenderness: There is no abdominal tenderness. There is no guarding.  Genitourinary:    Comments: Blind swabs collected Skin:    General: Skin is warm and dry.  Neurological:     Mental Status: She is alert and oriented to person, place, and time.  Psychiatric:        Mood and Affect: Mood normal.  Behavior: Behavior normal.        Thought Content: Thought content normal.    MAU Course  Procedures  MDM US report reviewed  Labs reviewed  GC pending   Results for orders placed or performed during the hospital encounter of 04/05/20 (from the past 24 hour(s))  Urinalysis, Routine w reflex microscopic     Status: Abnormal   Collection Time: 04/05/20  3:35 PM  Result Value Ref Range   Color, Urine YELLOW YELLOW   APPearance CLEAR CLEAR   Specific Gravity, Urine >1.030 (H) 1.005 - 1.030   pH 6.0 5.0 - 8.0   Glucose, UA NEGATIVE NEGATIVE mg/dL   Hgb urine dipstick NEGATIVE NEGATIVE    Bilirubin Urine NEGATIVE NEGATIVE   Ketones, ur NEGATIVE NEGATIVE mg/dL   Protein, ur NEGATIVE NEGATIVE mg/dL   Nitrite NEGATIVE NEGATIVE   Leukocytes,Ua NEGATIVE NEGATIVE  Pregnancy, urine     Status: Abnormal   Collection Time: 04/05/20  3:35 PM  Result Value Ref Range   Preg Test, Ur POSITIVE (A) NEGATIVE  HIV Antibody (routine testing w rflx)     Status: None   Collection Time: 04/05/20  6:10 PM  Result Value Ref Range   HIV Screen 4th Generation wRfx Non Reactive Non Reactive  hCG, quantitative, pregnancy     Status: Abnormal   Collection Time: 04/05/20  6:10 PM  Result Value Ref Range   hCG, Beta Chain, Quant, S 14,471 (H) <5 mIU/mL  CBC with Differential     Status: Abnormal   Collection Time: 04/05/20  6:10 PM  Result Value Ref Range   WBC 7.5 4.0 - 10.5 K/uL   RBC 4.56 3.87 - 5.11 MIL/uL   Hemoglobin 8.6 (L) 12.0 - 15.0 g/dL   HCT 40.929.1 (L) 81.136.0 - 91.446.0 %   MCV 63.8 (L) 80.0 - 100.0 fL   MCH 18.9 (L) 26.0 - 34.0 pg   MCHC 29.6 (L) 30.0 - 36.0 g/dL   RDW 78.227.2 (H) 95.611.5 - 21.315.5 %   Platelets 297 150 - 400 K/uL   nRBC 0.0 0.0 - 0.2 %   Neutrophils Relative % 67 %   Neutro Abs 5.0 1.7 - 7.7 K/uL   Lymphocytes Relative 26 %   Lymphs Abs 1.9 0.7 - 4.0 K/uL   Monocytes Relative 5 %   Monocytes Absolute 0.4 0.1 - 1.0 K/uL   Eosinophils Relative 1 %   Eosinophils Absolute 0.0 0.0 - 0.5 K/uL   Basophils Relative 1 %   Basophils Absolute 0.1 0.0 - 0.1 K/uL   WBC Morphology MORPHOLOGY UNREMARKABLE    RBC Morphology See Note    Smear Review Reviewed    Immature Granulocytes 0 %   Abs Immature Granulocytes 0.03 0.00 - 0.07 K/uL   Schistocytes PRESENT    Polychromasia PRESENT    Target Cells PRESENT    Ovalocytes PRESENT    Stomatocytes PRESENT    Giant PLTs PRESENT   ABO/Rh     Status: None   Collection Time: 04/05/20  6:10 PM  Result Value Ref Range   ABO/RH(D) B POS    No rh immune globuloin      NOT A RH IMMUNE GLOBULIN CANDIDATE, PT RH POSITIVE Performed at [facility], [address] [place], [place] [address]   Wet prep, genital     Status: Abnormal   Collection Time: 04/05/20  8:18 PM   Specimen: Vaginal  Result Value Ref Range   Yeast Wet Prep HPF POC  NONE SEEN NONE SEEN   Trich, Wet Prep PRESENT (A) NONE SEEN   Clue Cells Wet Prep HPF POC NONE SEEN NONE SEEN   WBC, Wet Prep HPF POC MANY (A) NONE SEEN   Sperm NONE SEEN    US OB LESS THAN 14 WEEKS WITH OB TRANSVAGINAL  Result Date: 04/05/2020 CLINICAL DATA:  Initial evaluation for acute pain, early pregnancy. EXAM: OBSTETRIC <14 WK ULTRASOUND TECHNIQUE: Transabdominal ultrasound was performed for evaluation of the gestation as well as the maternal uterus and adnexal regions. COMPARISON:  None available. FINDINGS: Intrauterine gestational sac: Single Yolk sac:  Present Embryo:  Negative. Cardiac Activity: Negative. Heart Rate: N/A MSD:  9.2 mm   5 w   5 d Subchorionic hemorrhage:  None visualized. Maternal uterus/adnexae: Right ovary within normal limits. Probable corpus luteal cyst noted within the left ovary. No adnexal mass or free fluid. IMPRESSION: 1. Early intrauterine gestational sac with internal yolk sac, but no fetal pole or cardiac activity yet visualized. Recommend follow-up quantitative B-HCG levels and follow-up US in 14 days to confirm and assess viability. 2. No other acute maternal uterine or adnexal abnormality. Electronically Signed   By: Rise Mu M.D.   On: 04/05/2020 21:49   Discussed results of [place] and labs with patient. Trich noted on wet prep- educated and discussed with patient STD and need for treatment. Flagyl ordered for patient in MAU prior to discharge home. Expedited partner therapy Rx sent with patient. Discussed to abstain from IC for 7 days after both parties are treated.   Normal IUP seen on [place] - follow up US ordered due to no embryo or HR seen at this time. Discussed with patient miscarriage precautions and reasons to present to MAU.   Discussed  reasons to return to MAU. Return to MAU as needed. Pt stable at time of discharge.   Assessment and Plan   1. Trichomonal vaginitis during pregnancy in first trimester   2. Abdominal pain, unspecified abdominal location   3. First trimester pregnancy   4. Pelvic pain affecting pregnancy   5. Normal IUP (intrauterine pregnancy) on prenatal ultrasound, first trimester   6. [redacted] weeks gestation of pregnancy    Discharge home Follow up US ordered  Encouraged to make initial prenatal appointment - patient plans to go to [facility]  Return to MAU as needed for reasons discussed and/or emergencies  Miscarriage precautions    Follow-up Information    [facility] up.   Specialty: Obstetrics and Gynecology Why: Make appointment to set up initial prenatal visit  Contact information: [address] [place] [address] [phone]              Allergies as of 04/05/2020      Reactions   Penicillins       Medication List    STOP taking these medications   ibuprofen 800 MG tablet Commonly known as: ADVIL     TAKE these medications   ferrous sulfate 325 (65 FE) MG tablet Take 325 mg by mouth daily with breakfast.   methocarbamol 500 MG tablet Commonly known as: ROBAXIN Take 1 tablet (500 mg total) by mouth every 8 (eight) hours as needed for muscle spasms.   predniSONE 10 MG (21) Tbpk tablet Commonly known as: STERAPRED UNI-PAK 21 TAB Take by mouth daily. Take 6 tablets first day and decrease by 1 tablet each day until finished   prenatal multivitamin Tabs tablet Take 1 tablet by mouth  daily at 12 noon.       Sharyon Cable 04/05/2020, 10:02 PM

## 2020-04-06 ENCOUNTER — Other Ambulatory Visit: Payer: Self-pay | Admitting: Certified Nurse Midwife

## 2020-04-06 DIAGNOSIS — O3680X0 Pregnancy with inconclusive fetal viability, not applicable or unspecified: Secondary | ICD-10-CM

## 2020-04-06 LAB — GC/CHLAMYDIA PROBE AMP (~~LOC~~) NOT AT ARMC
Chlamydia: NEGATIVE
Comment: NEGATIVE
Comment: NORMAL
Neisseria Gonorrhea: NEGATIVE

## 2020-04-06 LAB — RPR: RPR Ser Ql: NONREACTIVE

## 2020-04-07 LAB — PATHOLOGIST SMEAR REVIEW

## 2020-04-15 ENCOUNTER — Ambulatory Visit: Payer: Medicaid Other

## 2022-02-05 ENCOUNTER — Emergency Department (HOSPITAL_BASED_OUTPATIENT_CLINIC_OR_DEPARTMENT_OTHER): Payer: Medicaid Other

## 2022-02-05 ENCOUNTER — Emergency Department (HOSPITAL_BASED_OUTPATIENT_CLINIC_OR_DEPARTMENT_OTHER)
Admission: EM | Admit: 2022-02-05 | Discharge: 2022-02-05 | Disposition: A | Discharge status: Home / Self Care | Payer: Medicaid Other | Attending: Emergency Medicine | Admitting: Emergency Medicine

## 2022-02-05 ENCOUNTER — Encounter (HOSPITAL_BASED_OUTPATIENT_CLINIC_OR_DEPARTMENT_OTHER): Payer: Self-pay | Admitting: *Deleted

## 2022-02-05 DIAGNOSIS — R202 Paresthesia of skin: Secondary | ICD-10-CM | POA: Insufficient documentation

## 2022-02-05 DIAGNOSIS — R079 Chest pain, unspecified: Secondary | ICD-10-CM | POA: Insufficient documentation

## 2022-02-05 DIAGNOSIS — R42 Dizziness and giddiness: Secondary | ICD-10-CM | POA: Diagnosis not present

## 2022-02-05 DIAGNOSIS — R519 Headache, unspecified: Secondary | ICD-10-CM | POA: Diagnosis not present

## 2022-02-05 LAB — CBC WITH DIFFERENTIAL/PLATELET
Abs Immature Granulocytes: 0.01 10*3/uL (ref 0.00–0.07)
Basophils Absolute: 0 10*3/uL (ref 0.0–0.1)
Basophils Relative: 1 %
Eosinophils Absolute: 0.1 10*3/uL (ref 0.0–0.5)
Eosinophils Relative: 1 %
HCT: 38 % (ref 36.0–46.0)
Hemoglobin: 11.4 g/dL — ABNORMAL LOW (ref 12.0–15.0)
Immature Granulocytes: 0 %
Lymphocytes Relative: 31 %
Lymphs Abs: 1.6 10*3/uL (ref 0.7–4.0)
MCH: 21.6 pg — ABNORMAL LOW (ref 26.0–34.0)
MCHC: 30 g/dL (ref 30.0–36.0)
MCV: 72.1 fL — ABNORMAL LOW (ref 80.0–100.0)
Monocytes Absolute: 0.5 10*3/uL (ref 0.1–1.0)
Monocytes Relative: 9 %
Neutro Abs: 3 10*3/uL (ref 1.7–7.7)
Neutrophils Relative %: 58 %
Platelets: 265 10*3/uL (ref 150–400)
RBC: 5.27 MIL/uL — ABNORMAL HIGH (ref 3.87–5.11)
RDW: 25 % — ABNORMAL HIGH (ref 11.5–15.5)
WBC: 5.1 10*3/uL (ref 4.0–10.5)
nRBC: 0 % (ref 0.0–0.2)

## 2022-02-05 LAB — BASIC METABOLIC PANEL
Anion gap: 9 (ref 5–15)
BUN: 10 mg/dL (ref 6–20)
CO2: 24 mmol/L (ref 22–32)
Calcium: 9.4 mg/dL (ref 8.9–10.3)
Chloride: 105 mmol/L (ref 98–111)
Creatinine, Ser: 0.7 mg/dL (ref 0.44–1.00)
GFR, Estimated: >60 mL/min (ref >60)
Glucose, Bld: 77 mg/dL (ref 70–99)
Potassium: 3.8 mmol/L (ref 3.5–5.1)
Sodium: 138 mmol/L (ref 135–145)

## 2022-02-05 LAB — D-DIMER, QUANTITATIVE: D-Dimer, Quant: <0.27 ug/mL-FEU (ref 0.00–0.50)

## 2022-02-05 LAB — TROPONIN I (HIGH SENSITIVITY): Troponin I (High Sensitivity): <2 ng/L (ref <18)

## 2022-02-05 NOTE — Discharge Instructions (Addendum)
Work-up today is unremarkable.  To do more thorough work-up for chronic issues, I do think it might be reasonable to follow-up with neurology outpatient.  They should call you with appointment.

## 2022-02-05 NOTE — ED Provider Notes (Signed)
[facility] EMERGENCY DEPARTMENT Provider Note   CSN: [id number] Arrival date & time: 02/05/22  1025     History  Chief Complaint  Patient presents with   Dizziness    Ashley Bass is a 36 y.o. female.  Patient here for several issues.  For the last few months she has been having some intermittent dizziness, chest pain, arm tingling on the left.  She has history of anemia.  She has heavy menstrual cycles.  She is having some headaches.  Mostly having some dizziness today.  She is concerned about may be low blood count.  No recent surgery or travel.  No significant cardiac history or pulmonary history.  She not on birth control.  Denies any fever, cough, sputum production.  Has ongoing tingling in the left arm at times.  Denies any neck pain.  Mild headache.  The history is provided by the patient.       Home Medications Prior to Admission medications   Medication Sig Start Date End Date Taking? Authorizing Provider  ferrous sulfate 325 (65 FE) MG tablet Take 325 mg by mouth daily with breakfast.    [provider]  methocarbamol (ROBAXIN) 500 MG tablet Take 1 tablet (500 mg total) by mouth every 8 (eight) hours as needed for muscle spasms. 05/10/19   Hayden Rasmussen, MD  predniSONE (STERAPRED UNI-PAK 21 TAB) 10 MG (21) TBPK tablet Take by mouth daily. Take 6 tablets first day and decrease by 1 tablet each day until finished 05/10/19   Hayden Rasmussen, MD  Prenatal Vit-Fe Fumarate-FA (PRENATAL MULTIVITAMIN) TABS tablet Take 1 tablet by mouth daily at 12 noon.    [provider]      Allergies    Penicillins    Review of Systems   Review of Systems  Physical Exam Updated Vital Signs BP 107/67 (BP Location: Right Arm)   Pulse 68   Temp 98 F (36.7 C) (Oral)   Resp 14   Ht 5\' 3"  (1.6 m)   Wt 90.7 kg   SpO2 100%   BMI 35.43 kg/m  Physical Exam Vitals and nursing note reviewed.  Constitutional:      General: She is not in acute  distress.    Appearance: She is well-developed. She is not ill-appearing.  HENT:     Head: Normocephalic and atraumatic.     Nose: Nose normal.     Mouth/Throat:     Mouth: Mucous membranes are moist.  Eyes:     Extraocular Movements: Extraocular movements intact.     Conjunctiva/sclera: Conjunctivae normal.     Pupils: Pupils are equal, round, and reactive to light.  Cardiovascular:     Rate and Rhythm: Normal rate and regular rhythm.     Pulses: Normal pulses.     Heart sounds: Normal heart sounds. No murmur heard. Pulmonary:     Effort: Pulmonary effort is normal. No respiratory distress.     Breath sounds: Normal breath sounds.  Abdominal:     Palpations: Abdomen is soft.     Tenderness: There is no abdominal tenderness.  Musculoskeletal:        General: No swelling.     Cervical back: Normal range of motion and neck supple.  Skin:    General: Skin is warm and dry.     Capillary Refill: Capillary refill takes less than 2 seconds.  Neurological:     General: No focal deficit present.     Mental Status: She is alert  and oriented to person, place, and time.     Cranial Nerves: No cranial nerve deficit.     Sensory: No sensory deficit.     Motor: No weakness.     Coordination: Coordination normal.     Comments: 5+ out of 5 strength throughout, some decrease sensation in the second and third portion of her left hand and around her left deltoid area but otherwise sensations intact, normal speech, normal visual fields, normal cerebellar signs  Psychiatric:        Mood and Affect: Mood normal.     ED Results / Procedures / Treatments   Labs (all labs ordered are listed, but only abnormal results are displayed) Labs Reviewed  CBC WITH DIFFERENTIAL/PLATELET - Abnormal; Notable for the following components:      Result Value   RBC 5.27 (*)    Hemoglobin 11.4 (*)    MCV 72.1 (*)    MCH 21.6 (*)    RDW 25.0 (*)    All other components within normal limits  BASIC METABOLIC  PANEL  D-DIMER, QUANTITATIVE  PATHOLOGIST SMEAR REVIEW  TROPONIN I (HIGH SENSITIVITY)    EKG EKG Interpretation  Date/Time:  Sunday February 05 2022 10:49:25 EDT Ventricular Rate:  79 PR Interval:    QRS Duration: 101 QT Interval:  421 QTC Calculation: 483 R Axis:   -69 Text Interpretation: Normal sinus rhythm Confirmed by Virgina Norfolk ([id number]) on 02/05/2022 10:51:34 AM  Radiology CT Head Wo Contrast  Result Date: 02/05/2022 CLINICAL DATA:  Headache EXAM: CT HEAD WITHOUT CONTRAST TECHNIQUE: Contiguous axial images were obtained from the base of the skull through the vertex without intravenous contrast. RADIATION DOSE REDUCTION: This exam was performed according to the departmental dose-optimization program which includes automated exposure control, adjustment of the mA and/or kV according to patient size and/or use of iterative reconstruction technique. COMPARISON:  Four hundred nineteen 19 FINDINGS: Brain: No evidence of acute infarction, hemorrhage, hydrocephalus, extra-axial collection or mass lesion/mass effect. The medial cortex of the left temporal lobe is poorly visualized, which may be due to streak artifact. Vascular: No hyperdense vessel or unexpected calcification. Skull: Normal. Negative for fracture or focal lesion. Sinuses/Orbits: No acute finding. Other: None. IMPRESSION: 1. No acute intracranial abnormality. 2. Nonspecific hypodensity left middle cranial fossa is likely related to streak artifact. If there are symptoms likely refer to region, further evaluation with MRI could be considered. Electronically Signed   By: Lorenza Cambridge M.D.   On: 02/05/2022 11:48   DG Chest Portable 1 View  Result Date: 02/05/2022 CLINICAL DATA:  Dizziness.  Chest pain. EXAM: PORTABLE CHEST 1 VIEW COMPARISON:  04/30/2017 FINDINGS: The heart size and mediastinal contours are within normal limits. Both lungs are clear. The visualized skeletal structures are unremarkable. IMPRESSION: Normal exam.  Electronically Signed   By: Danae Orleans M.D.   On: 02/05/2022 11:08    Procedures Procedures    Medications Ordered in ED Medications - No data to display  ED Course/ Medical Decision Making/ A&P                           Medical Decision Making Amount and/or Complexity of Data Reviewed Labs: ordered. Radiology: ordered.   Ralynn San is here for dizziness, chest pain.  Normal vitals.  No fever.  Differential diagnosis possibly peripheral vertigo versus anxiety versus MSK process but will evaluate for ACS, PE, pneumonia or could be symptomatic anemia..  We will get a CT  scan of the head given headaches and dizziness over the last several months.  I have no concern for stroke at this time.  Seems atypical for MS or other neuromuscular disorder.  Overall we will check CBC, BMP, D-dimer, troponin, EKG, chest x-ray, head CT.  Check pregnancy test.  EKG per my review interpretation shows sinus rhythm.  No ischemic changes.  Per my review and interpretation of labs is no significant anemia, electrolyte abnormality, kidney injury.  D-dimer negative.  Troponin negative.  No concern for ACS or PE.  Chest x-ray per my review and interpretation shows no pneumonia or pneumothorax.  CT scan shows no head bleed per my review and interpretation.  Radiology report states that there is a nonspecific hypodensity in the left middle cranial fossa.  This is likely a streak artifact.  This also does not correlate with any of her symptoms.  As sometimes she has numbness in her left arm.  This does not correlate clinically.  I talked with Dr. Jerrell Belfast with neurology and he agrees no further work-up necessary.  Given some of the paresthesias she has been having and dizziness we will have her follow-up with neurology.  Overall discharged in good condition.  Understands return precautions.  This chart was dictated using voice recognition software.  Despite best efforts to proofread,  errors can occur which can change  the documentation meaning.         Final Clinical Impression(s) / ED Diagnoses Final diagnoses:  Dizziness  Paresthesia    Rx / DC Orders ED Discharge Orders          Ordered    Ambulatory referral to Neurology       Comments: An appointment is requested in approximately: 2 weeks   02/05/22 1252              Virgina Norfolk, DO 02/05/22 1254

## 2022-02-05 NOTE — ED Notes (Signed)
Unable to obtain IV access, labs collected, pt tolerated well

## 2022-02-05 NOTE — ED Notes (Signed)
Pt discharged to home. Discharge instructions have been discussed with patient and/or family members. Pt verbally acknowledges understanding d/c instructions, and endorses comprehension to checkout at registration before leaving.  °

## 2022-02-05 NOTE — ED Triage Notes (Signed)
States for the past week has had some dizziness, "room spinning" , does have some left upper arm numbness that has been present intermittently for many months. Complaints of upper mid sternal chest discomfort non radiating has also been present intermittently for the past several months. Otherwise has been healthy since delivery of child approx 1 year ago. C section delivery

## 2022-02-07 LAB — PATHOLOGIST SMEAR REVIEW

## 2022-02-28 IMAGING — US US OB < 14 WEEKS - US OB TV
1 series · 15 of 28 positions shown · non-contrast
Comparison: None available.

CLINICAL DATA: Initial evaluation for acute pain, early pregnancy.

EXAM:
OBSTETRIC <14 WK ULTRASOUND
TECHNIQUE: Transabdominal ultrasound was performed for evaluation of the
gestation as well as the maternal uterus and adnexal regions.

[Series 1: us ob < 14 weeks - us ob tv · 15 of 69 slices shown]
[im 1/69]
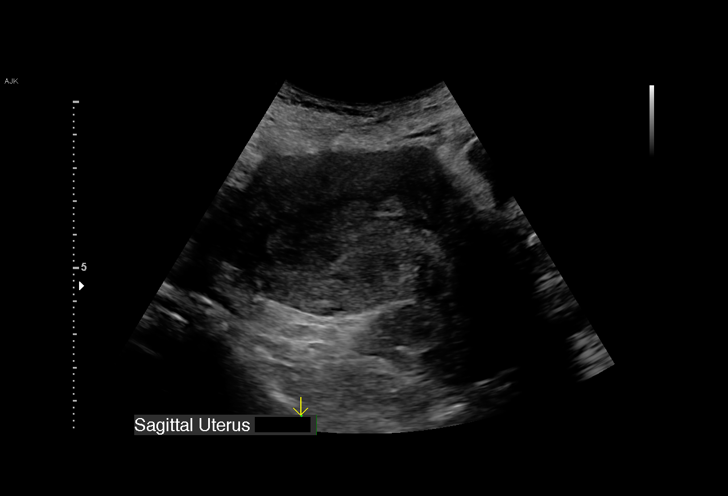
[im 6/69]
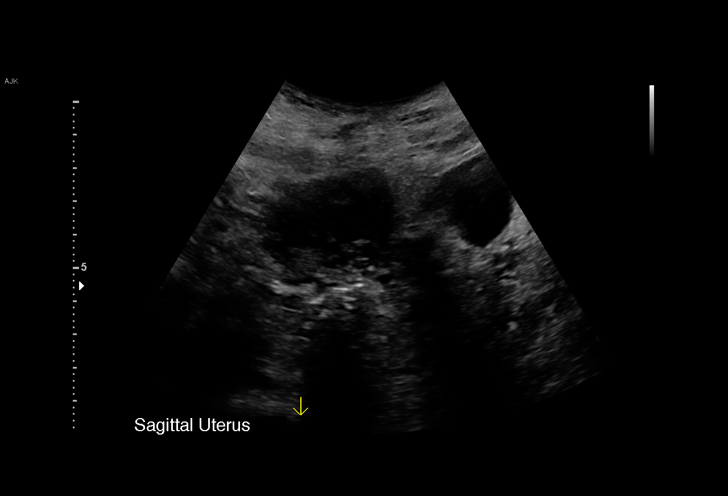
[im 11/69]
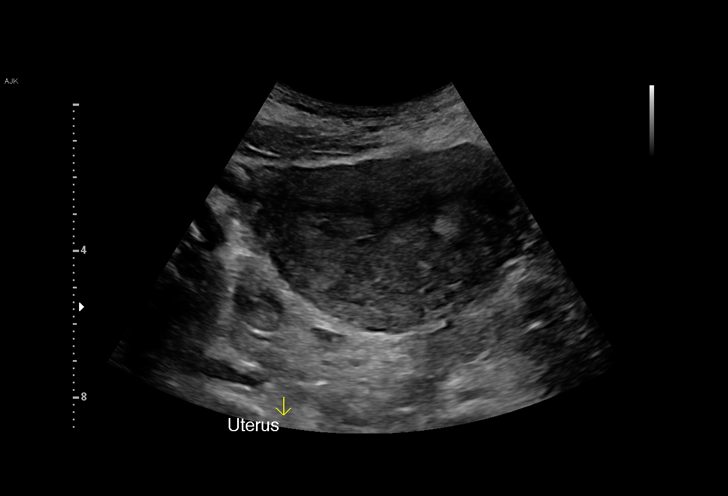
[im 16/69]
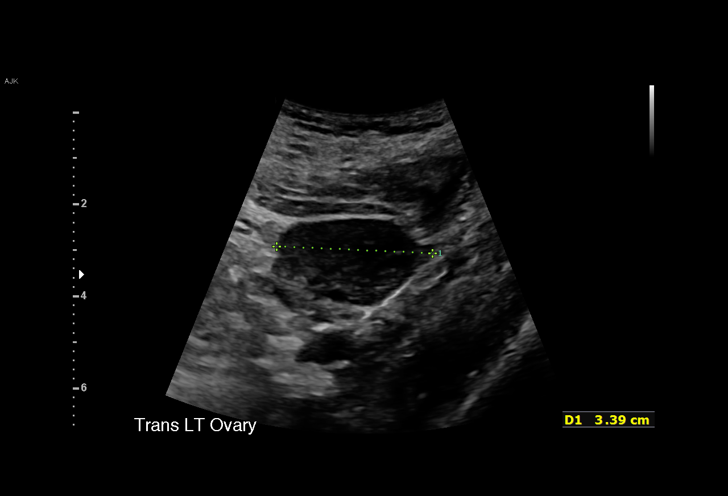
[im 21/69]
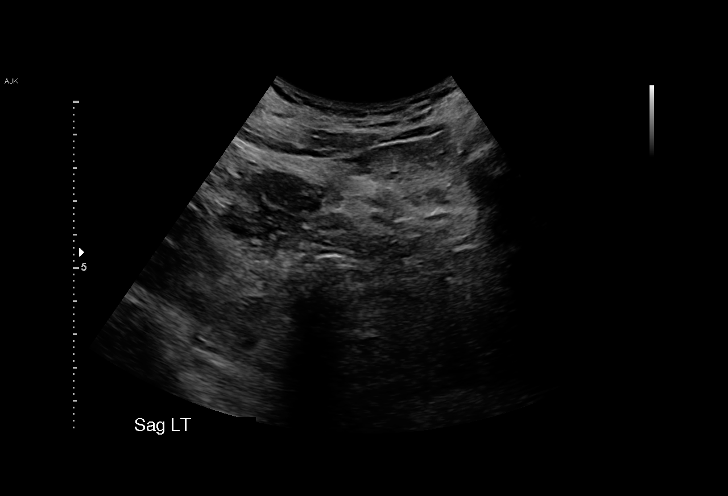
[im 26/69]
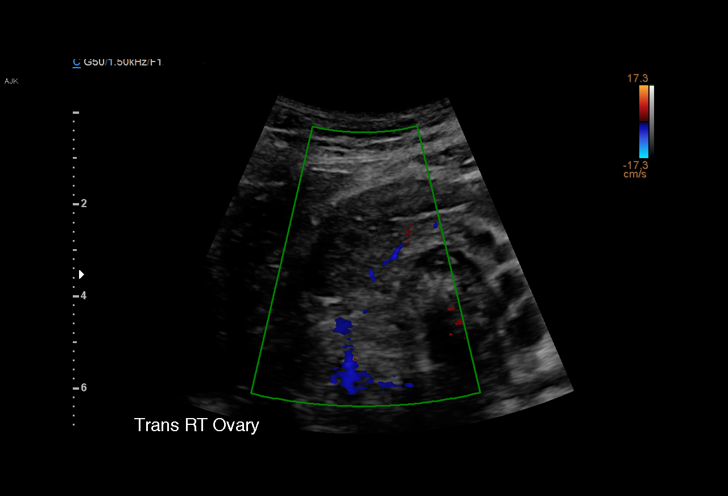
[im 31/69]
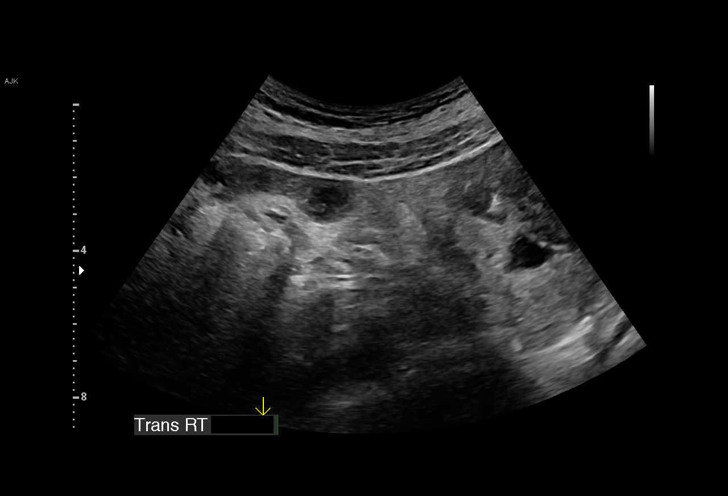
[im 36/69]
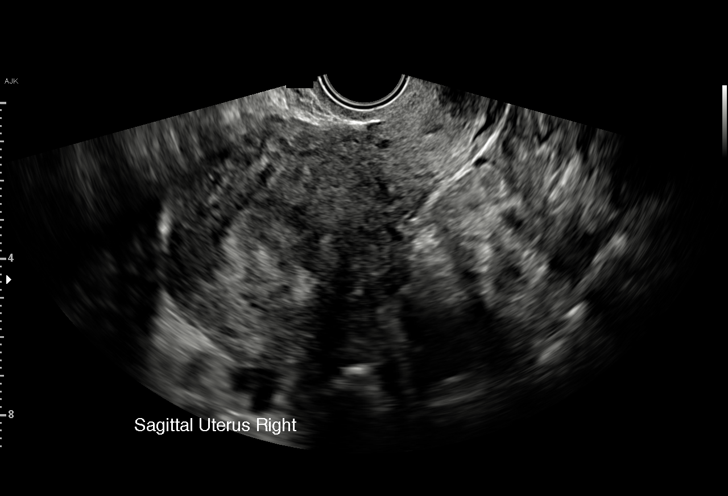
[im 38/69]
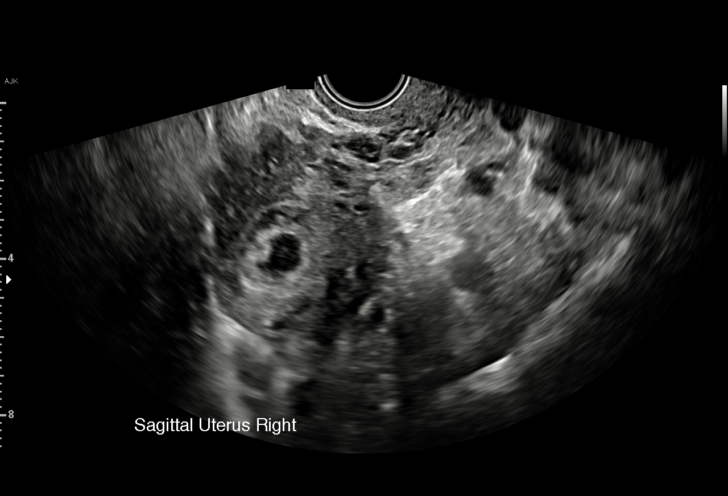
[im 43/69]
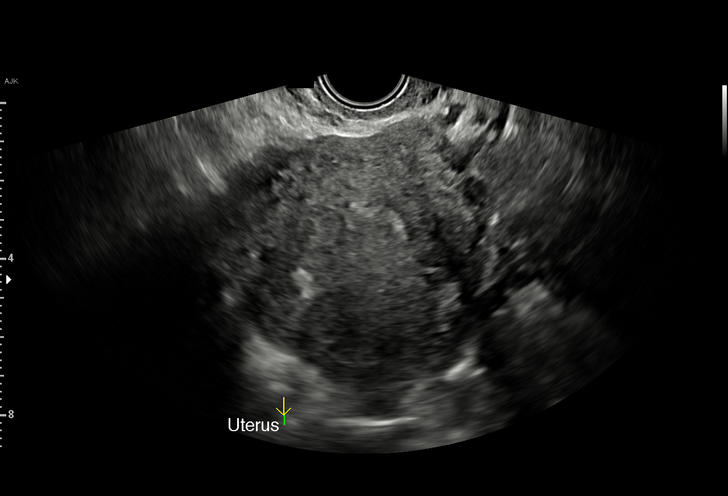
[im 48/69]
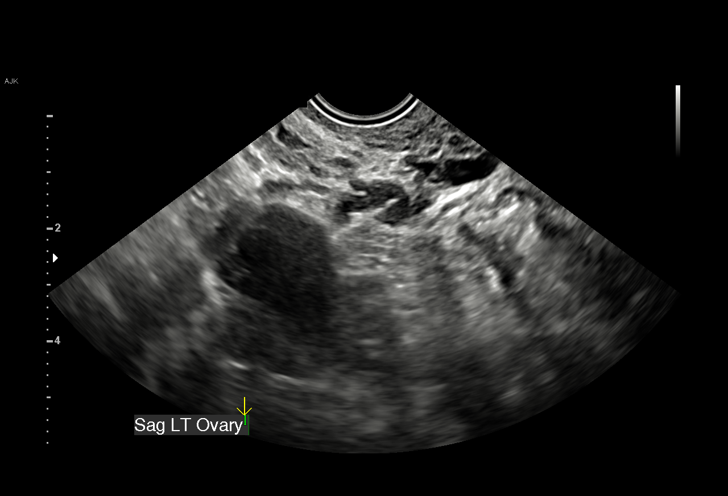
[im 53/69]
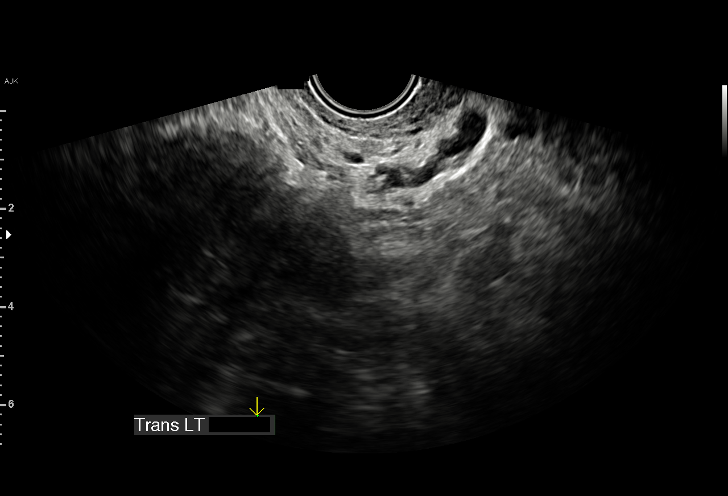
[im 58/69]
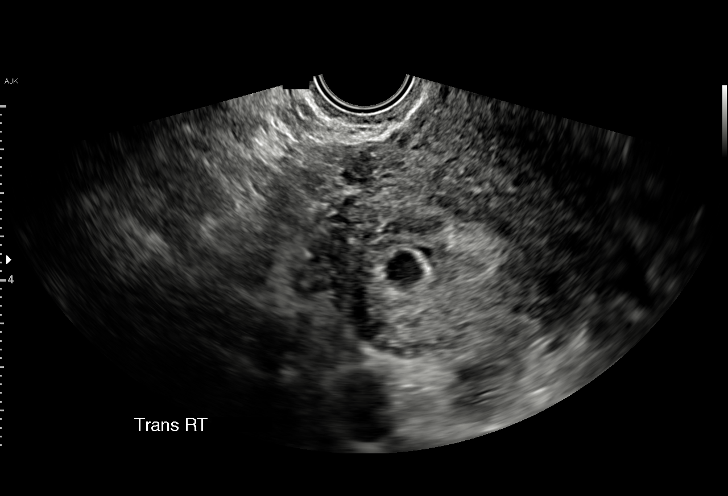
[im 63/69]
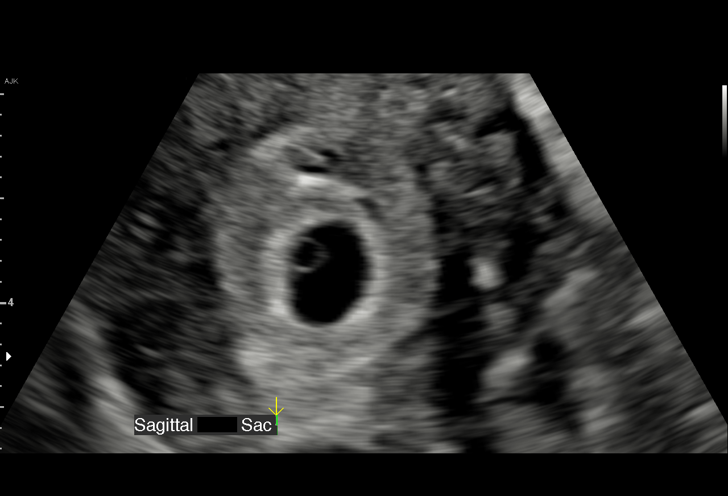
[im 69/69]
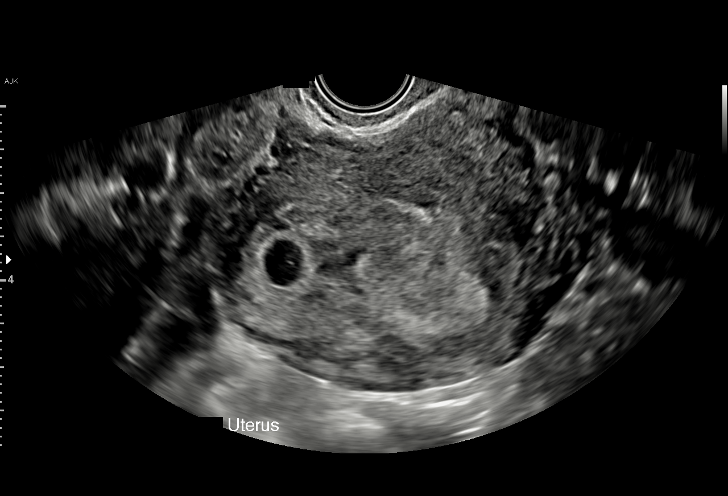

[15 of 28 positions shown; findings below may reference images not displayed]

FINDINGS: Intrauterine gestational sac: Single

Yolk sac:  Present

Embryo:  Negative.

Cardiac Activity: Negative.

Heart Rate: N/A

MSD:  9.2 mm   5 w   5 d

Subchorionic hemorrhage:  None visualized.

Maternal uterus/adnexae: Right ovary within normal limits. Probable
corpus luteal cyst noted within the left ovary. No adnexal mass or
free fluid.
IMPRESSION: 1. Early intrauterine gestational sac with internal yolk sac, but no
fetal pole or cardiac activity yet visualized. Recommend follow-up
quantitative B-HCG levels and follow-up US in 14 days to confirm and
assess viability.
2. No other acute maternal uterine or adnexal abnormality.

## 2023-12-24 ENCOUNTER — Other Ambulatory Visit: Payer: Self-pay

## 2023-12-24 ENCOUNTER — Emergency Department (HOSPITAL_BASED_OUTPATIENT_CLINIC_OR_DEPARTMENT_OTHER)

## 2023-12-24 ENCOUNTER — Emergency Department (HOSPITAL_BASED_OUTPATIENT_CLINIC_OR_DEPARTMENT_OTHER)
Admission: EM | Admit: 2023-12-24 | Discharge: 2023-12-24 | Discharge status: Against Medical Advice | Attending: Emergency Medicine | Admitting: Emergency Medicine

## 2023-12-24 ENCOUNTER — Encounter (HOSPITAL_BASED_OUTPATIENT_CLINIC_OR_DEPARTMENT_OTHER): Payer: Self-pay | Admitting: Emergency Medicine

## 2023-12-24 DIAGNOSIS — Z5321 Procedure and treatment not carried out due to patient leaving prior to being seen by health care provider: Secondary | ICD-10-CM | POA: Insufficient documentation

## 2023-12-24 DIAGNOSIS — R0602 Shortness of breath: Secondary | ICD-10-CM | POA: Insufficient documentation

## 2023-12-24 DIAGNOSIS — R110 Nausea: Secondary | ICD-10-CM | POA: Insufficient documentation

## 2023-12-24 DIAGNOSIS — R079 Chest pain, unspecified: Secondary | ICD-10-CM | POA: Diagnosis present

## 2023-12-24 LAB — CBC
HCT: 31.5 % — ABNORMAL LOW (ref 36.0–46.0)
Hemoglobin: 9.6 g/dL — ABNORMAL LOW (ref 12.0–15.0)
MCH: 21.4 pg — ABNORMAL LOW (ref 26.0–34.0)
MCHC: 30.5 g/dL (ref 30.0–36.0)
MCV: 70.2 fL — ABNORMAL LOW (ref 80.0–100.0)
Platelets: 303 K/uL (ref 150–400)
RBC: 4.49 MIL/uL (ref 3.87–5.11)
RDW: 19.7 % — ABNORMAL HIGH (ref 11.5–15.5)
WBC: 5.3 K/uL (ref 4.0–10.5)
nRBC: 0 % (ref 0.0–0.2)

## 2023-12-24 LAB — BASIC METABOLIC PANEL WITH GFR
Anion gap: 13 (ref 5–15)
BUN: 13 mg/dL (ref 6–20)
CO2: 23 mmol/L (ref 22–32)
Calcium: 9.6 mg/dL (ref 8.9–10.3)
Chloride: 104 mmol/L (ref 98–111)
Creatinine, Ser: 0.74 mg/dL (ref 0.44–1.00)
GFR, Estimated: >60 mL/min (ref >60)
Glucose, Bld: 101 mg/dL — ABNORMAL HIGH (ref 70–99)
Potassium: 3.7 mmol/L (ref 3.5–5.1)
Sodium: 139 mmol/L (ref 135–145)

## 2023-12-24 LAB — TROPONIN T, HIGH SENSITIVITY: Troponin T High Sensitivity: <15 ng/L (ref 0–19)

## 2023-12-24 LAB — PREGNANCY, URINE: Preg Test, Ur: NEGATIVE

## 2023-12-24 NOTE — ED Triage Notes (Signed)
 Pt c/o chest pain x 3 days. + nausea. +shob
